# Patient Record
Sex: Male | Born: 1960
Health system: Southern US, Community
[De-identification: ages and names within clinical notes are randomized; demographics above are authoritative.]

## PROBLEM LIST (undated history)

## (undated) DIAGNOSIS — E7800 Pure hypercholesterolemia, unspecified: Secondary | ICD-10-CM

## (undated) DIAGNOSIS — F410 Panic disorder [episodic paroxysmal anxiety] without agoraphobia: Secondary | ICD-10-CM

## (undated) DIAGNOSIS — N529 Male erectile dysfunction, unspecified: Secondary | ICD-10-CM

## (undated) DIAGNOSIS — K219 Gastro-esophageal reflux disease without esophagitis: Secondary | ICD-10-CM

## (undated) DIAGNOSIS — I4891 Unspecified atrial fibrillation: Secondary | ICD-10-CM

## (undated) DIAGNOSIS — G4733 Obstructive sleep apnea (adult) (pediatric): Secondary | ICD-10-CM

## (undated) DIAGNOSIS — M1990 Unspecified osteoarthritis, unspecified site: Secondary | ICD-10-CM

## (undated) DIAGNOSIS — E785 Hyperlipidemia, unspecified: Secondary | ICD-10-CM

## (undated) DIAGNOSIS — I639 Cerebral infarction, unspecified: Secondary | ICD-10-CM

## (undated) DIAGNOSIS — I10 Essential (primary) hypertension: Secondary | ICD-10-CM

## (undated) DIAGNOSIS — E291 Testicular hypofunction: Secondary | ICD-10-CM

## (undated) HISTORY — DX: Pure hypercholesterolemia, unspecified: E78.00

## (undated) HISTORY — DX: Gastro-esophageal reflux disease without esophagitis: K21.9

## (undated) HISTORY — PX: RHINOPLASTY: SUR1284

## (undated) HISTORY — PX: ROTATOR CUFF REPAIR: SHX139

## (undated) HISTORY — DX: Testicular hypofunction: E29.1

## (undated) HISTORY — DX: Panic disorder (episodic paroxysmal anxiety): F41.0

## (undated) HISTORY — PX: EYE SURGERY: SHX253

## (undated) HISTORY — DX: Obstructive sleep apnea (adult) (pediatric): G47.33

## (undated) HISTORY — PX: HERNIA REPAIR: SHX51

## (undated) HISTORY — PX: COLONOSCOPY: SHX174

## (undated) HISTORY — DX: Male erectile dysfunction, unspecified: N52.9

## (undated) HISTORY — DX: Hyperlipidemia, unspecified: E78.5

---

## 2006-12-07 ENCOUNTER — Ambulatory Visit (HOSPITAL_COMMUNITY): Admission: RE | Admit: 2006-12-07 | Discharge: 2006-12-07 | Payer: Self-pay | Admitting: Interventional Cardiology

## 2007-01-28 ENCOUNTER — Inpatient Hospital Stay (HOSPITAL_COMMUNITY): Admission: EM | Admit: 2007-01-28 | Discharge: 2007-01-31 | Payer: Self-pay | Admitting: Emergency Medicine

## 2013-07-17 ENCOUNTER — Other Ambulatory Visit (HOSPITAL_BASED_OUTPATIENT_CLINIC_OR_DEPARTMENT_OTHER): Payer: Self-pay | Admitting: Family Medicine

## 2013-07-17 ENCOUNTER — Encounter (HOSPITAL_BASED_OUTPATIENT_CLINIC_OR_DEPARTMENT_OTHER): Payer: Self-pay

## 2013-07-17 ENCOUNTER — Ambulatory Visit (HOSPITAL_BASED_OUTPATIENT_CLINIC_OR_DEPARTMENT_OTHER)
Admission: RE | Admit: 2013-07-17 | Discharge: 2013-07-17 | Disposition: A | Discharge status: Home / Self Care | Payer: Managed Care, Other (non HMO) | Source: Ambulatory Visit | Attending: Family Medicine | Admitting: Family Medicine

## 2013-07-17 DIAGNOSIS — R197 Diarrhea, unspecified: Secondary | ICD-10-CM | POA: Insufficient documentation

## 2013-07-17 DIAGNOSIS — R109 Unspecified abdominal pain: Secondary | ICD-10-CM

## 2013-07-17 MED ORDER — IOHEXOL 300 MG/ML  SOLN
100.0000 mL | Freq: Once | INTRAMUSCULAR | Status: AC | PRN
  Administered 2013-07-17: 100 mL via INTRAVENOUS

## 2013-08-03 ENCOUNTER — Encounter: Payer: Self-pay | Admitting: Interventional Cardiology

## 2013-08-03 ENCOUNTER — Encounter: Payer: Self-pay | Admitting: *Deleted

## 2013-08-03 DIAGNOSIS — F410 Panic disorder [episodic paroxysmal anxiety] without agoraphobia: Secondary | ICD-10-CM | POA: Insufficient documentation

## 2013-08-03 DIAGNOSIS — E785 Hyperlipidemia, unspecified: Secondary | ICD-10-CM | POA: Insufficient documentation

## 2013-08-03 DIAGNOSIS — E7800 Pure hypercholesterolemia, unspecified: Secondary | ICD-10-CM | POA: Insufficient documentation

## 2013-08-03 DIAGNOSIS — E291 Testicular hypofunction: Secondary | ICD-10-CM | POA: Insufficient documentation

## 2013-08-03 DIAGNOSIS — G4733 Obstructive sleep apnea (adult) (pediatric): Secondary | ICD-10-CM | POA: Insufficient documentation

## 2013-08-03 DIAGNOSIS — K219 Gastro-esophageal reflux disease without esophagitis: Secondary | ICD-10-CM | POA: Insufficient documentation

## 2013-08-05 ENCOUNTER — Ambulatory Visit (INDEPENDENT_AMBULATORY_CARE_PROVIDER_SITE_OTHER): Payer: Managed Care, Other (non HMO) | Admitting: Cardiology

## 2013-08-05 ENCOUNTER — Encounter: Payer: Self-pay | Admitting: Cardiology

## 2013-08-05 VITALS — BP 142/80 | HR 81 | Ht 70.0 in | Wt 199.0 lb

## 2013-08-05 DIAGNOSIS — G4733 Obstructive sleep apnea (adult) (pediatric): Secondary | ICD-10-CM

## 2013-08-05 NOTE — Progress Notes (Signed)
  [address] [place], [place]  [address] Phone: [phone] Fax:  [phone]  Date:  08/05/2013   ID:  Brett Humphrey, DOB [birth year], MRN [id number]  PCP:  Brett Boys, MD  Sleep Medicine:  Brett Magic, MD   History of Present Illness: Brett Humphrey is a 52 y.o. male with a history of OSA/obesity.  He is doing well.  He tolerates his CPAP device well.  He tolerates the full face mask and feels the pressure is adequate.  He feels rested when he gets up in the am and has no daytime sleepiness. He does not think he snores when wearing the mask.   He does not get much aerobic exercise.   Wt Readings from Last 3 Encounters:  08/05/13 199 lb (90.266 kg)     Past Medical History  Diagnosis Date  . Hyperlipidemia   . Hypercholesteremia   . GERD (gastroesophageal reflux disease)   . Panic disorder without agoraphobia   . Male hypogonadism   . OSA (obstructive sleep apnea)     Current Outpatient Prescriptions  Medication Sig Dispense Refill  . ANDROGEL PUMP 20.25 MG/ACT (1.62%) GEL Apply to skin daily      . Multiple Vitamin (MULTIVITAMIN) capsule Take 1 capsule by mouth daily.      . naproxen (NAPROSYN) 500 MG tablet As needed  (shoulder surgery)      . Omega-3 Fatty Acids (FISH OIL PO) Take 1 tablet by mouth daily.      Marland Kitchen omeprazole (PRILOSEC) 40 MG capsule Take 40 mg by mouth daily.      . rosuvastatin (CRESTOR) 20 MG tablet Take 20 mg by mouth daily.      . vardenafil (LEVITRA) 10 MG tablet Take 10 mg by mouth daily as needed for erectile dysfunction.      Marland Kitchen venlafaxine (EFFEXOR) 75 MG tablet Take 1 tab daily      . zolpidem (AMBIEN) 10 MG tablet Take 10 mg by mouth at bedtime as needed for sleep.       No current facility-administered medications for this visit.    Allergies:    Allergies  Allergen Reactions  . Simvastatin      myalgias     Social History:  The patient  reports that he has quit smoking. He does not have any smokeless tobacco history on file.     Family History:  The patient's family history includes Asthma in his sister; Cancer in his brother and mother; Heart attack in his father.   ROS:  Please see the history of present illness.      All other systems reviewed and negative.   PHYSICAL EXAM: VS:  BP 142/80  Pulse 81  Ht 5\' 10"  (1.778 m)  Wt 199 lb (90.266 kg)  BMI 28.55 kg/m2 Well nourished, well developed, in no acute distress HEENT: normal Neck: no JVD Cardiac:  normal S1, S2; RRR; no murmur Lungs:  clear to auscultation bilaterally, no wheezing, rhonchi or rales Abd: soft, nontender, no hepatomegaly Ext: no edema Skin: warm and dry Neuro:  CNs 2-12 intact, no focal abnormalities noted       ASSESSMENT AND PLAN:  1. OSA on CPAP and tolerating well  - I will get a download from his DME 2. Obesity  - I encouraged him to increase his aerobic exercise.  Signed, Brett Magic, MD 08/05/2013 3:43 PM

## 2013-08-05 NOTE — Patient Instructions (Signed)
Your physician wants you to follow-up in: 6 Months with Dr. Turner You will receive a reminder letter in the mail two months in advance. If you don't receive a letter, please call our office to schedule the follow-up appointment.  

## 2013-11-07 ENCOUNTER — Encounter: Payer: Self-pay | Admitting: General Surgery

## 2016-08-23 ENCOUNTER — Other Ambulatory Visit: Payer: Self-pay | Admitting: Family Medicine

## 2016-08-23 ENCOUNTER — Ambulatory Visit
Admission: RE | Admit: 2016-08-23 | Discharge: 2016-08-23 | Disposition: A | Discharge status: Home / Self Care | Payer: Managed Care, Other (non HMO) | Source: Ambulatory Visit | Attending: Family Medicine | Admitting: Family Medicine

## 2016-08-23 DIAGNOSIS — R1031 Right lower quadrant pain: Secondary | ICD-10-CM

## 2016-08-31 ENCOUNTER — Other Ambulatory Visit: Payer: Self-pay | Admitting: Family Medicine

## 2016-08-31 DIAGNOSIS — R1031 Right lower quadrant pain: Secondary | ICD-10-CM

## 2016-09-04 ENCOUNTER — Other Ambulatory Visit (INDEPENDENT_AMBULATORY_CARE_PROVIDER_SITE_OTHER): Payer: Self-pay | Admitting: Physician Assistant

## 2016-09-05 NOTE — Telephone Encounter (Signed)
Please advise 

## 2016-09-07 ENCOUNTER — Ambulatory Visit
Admission: RE | Admit: 2016-09-07 | Discharge: 2016-09-07 | Disposition: A | Discharge status: Home / Self Care | Payer: Managed Care, Other (non HMO) | Source: Ambulatory Visit | Attending: Family Medicine | Admitting: Family Medicine

## 2016-09-07 DIAGNOSIS — R1031 Right lower quadrant pain: Secondary | ICD-10-CM

## 2016-09-07 MED ORDER — IOPAMIDOL (ISOVUE-300) INJECTION 61%
100.0000 mL | Freq: Once | INTRAVENOUS | Status: AC | PRN
  Administered 2016-09-07: 100 mL via INTRAVENOUS

## 2016-12-02 ENCOUNTER — Other Ambulatory Visit (INDEPENDENT_AMBULATORY_CARE_PROVIDER_SITE_OTHER): Payer: Self-pay | Admitting: Physician Assistant

## 2016-12-02 NOTE — Telephone Encounter (Signed)
Please advise 

## 2017-02-27 ENCOUNTER — Other Ambulatory Visit (INDEPENDENT_AMBULATORY_CARE_PROVIDER_SITE_OTHER): Payer: Self-pay | Admitting: Physician Assistant

## 2017-05-19 ENCOUNTER — Other Ambulatory Visit (INDEPENDENT_AMBULATORY_CARE_PROVIDER_SITE_OTHER): Payer: Self-pay | Admitting: Physician Assistant

## 2017-05-30 ENCOUNTER — Other Ambulatory Visit: Payer: Self-pay | Admitting: Family Medicine

## 2017-05-30 ENCOUNTER — Ambulatory Visit
Admission: RE | Admit: 2017-05-30 | Discharge: 2017-05-30 | Disposition: A | Discharge status: Home / Self Care | Payer: Commercial Managed Care - PPO | Source: Ambulatory Visit | Attending: Family Medicine | Admitting: Family Medicine

## 2017-05-30 DIAGNOSIS — Z01818 Encounter for other preprocedural examination: Secondary | ICD-10-CM

## 2017-06-12 HISTORY — PX: BACK SURGERY: SHX140

## 2017-08-06 ENCOUNTER — Other Ambulatory Visit (INDEPENDENT_AMBULATORY_CARE_PROVIDER_SITE_OTHER): Payer: Self-pay | Admitting: Physician Assistant

## 2017-11-09 ENCOUNTER — Ambulatory Visit
Admission: RE | Admit: 2017-11-09 | Discharge: 2017-11-09 | Disposition: A | Discharge status: Home / Self Care | Payer: Commercial Managed Care - PPO | Source: Ambulatory Visit | Attending: General Surgery | Admitting: General Surgery

## 2017-11-09 ENCOUNTER — Other Ambulatory Visit: Payer: Self-pay | Admitting: General Surgery

## 2017-11-09 DIAGNOSIS — K4020 Bilateral inguinal hernia, without obstruction or gangrene, not specified as recurrent: Secondary | ICD-10-CM

## 2017-11-13 ENCOUNTER — Ambulatory Visit: Payer: Self-pay | Admitting: General Surgery

## 2017-11-13 ENCOUNTER — Encounter (HOSPITAL_COMMUNITY): Payer: Self-pay | Admitting: *Deleted

## 2017-11-13 ENCOUNTER — Other Ambulatory Visit (INDEPENDENT_AMBULATORY_CARE_PROVIDER_SITE_OTHER): Payer: Self-pay | Admitting: Orthopaedic Surgery

## 2017-11-13 NOTE — Patient Instructions (Addendum)
Brett Humphrey  11/13/2017   Your procedure is scheduled on: Friday 11/17/2017  Report to [facility] Main  Entrance              Report to admitting at  1130 AM   Call this number if you have problems the morning of surgery [phone]               Please bring mask and tubing for BIPAP morning of surgery!   Remember: Do not eat food After Midnight.  May have clear liquids from midnight up until 0730 am morning of surgery and then nothing until after surgery!    CLEAR LIQUID DIET   Foods Allowed                                                                     Foods Excluded  Coffee and tea, regular and decaf                             liquids that you cannot  Plain Jell-O in any flavor                                             see through such as: Fruit ices (not with fruit pulp)                                     milk, soups, orange juice  Iced Popsicles                                    All solid food Carbonated beverages, regular and diet                                    Cranberry, grape and apple juices Sports drinks like Gatorade Lightly seasoned clear broth or consume(fat free) Sugar, honey syrup  Sample Menu Breakfast                                Lunch                                     Supper Cranberry juice                    Beef broth                            Chicken broth Jell-O  Grape juice                           Apple juice Coffee or tea                        Jell-O                                      Popsicle                                                Coffee or tea                        Coffee or tea  _____________________________________________________________________       Take these medicines the morning of surgery with A SIP OF WATER:  Venlafaxine (Effexor), Rosuvastatin (Crestor), Ranitidine (Zantac), Percocet if needed                                You may not have  any metal on your body including hair pins and              piercings  Do not wear jewelry, make-up, lotions, powders or perfumes, deodorant             Do not wear nail polish.  Do not shave  48 hours prior to surgery.              Men may shave face and neck.   Do not bring valuables to the hospital. [facility] IS NOT             RESPONSIBLE   FOR VALUABLES.  Contacts, dentures or bridgework may not be worn into surgery.  Leave suitcase in the car. After surgery it may be brought to your room.     Patients discharged the day of surgery will not be allowed to drive home.  Name and phone number of your driver:spouse- Ann  Special Instructions: N/A              Please read over the following fact sheets you were given: _____________________________________________________________________             [facility] Pre[facility] Before surgery, you can play an important role.  Because skin is not sterile, your skin needs to be as free of germs as possible.  You can reduce the number of germs on your skin by washing with CHG (chlorahexidine gluconate) soap before surgery.  CHG is an antiseptic cleaner which kills germs and bonds with the skin to continue killing germs even after washing. Please DO NOT use if you have an allergy to CHG or antibacterial soaps.  If your skin becomes reddened/irritated stop using the CHG and inform your nurse when you arrive at Short Stay. Do not shave (including legs and underarms) for at least 48 hours prior to the first CHG shower.  You may shave your face/neck. Please follow these instructions carefully:  1.  Shower with CHG Soap the night before surgery and the  morning of Surgery.  2.  If you choose to wash your  hair, wash your hair first as usual with your  normal  shampoo.  3.  After you shampoo, rinse your hair and body thoroughly to remove the  shampoo.                           4.  Use CHG as you would any other liquid soap.  You can apply chg  directly  to the skin and wash                       Gently with a scrungie or clean washcloth.  5.  Apply the CHG Soap to your body ONLY FROM THE NECK DOWN.   Do not use on face/ open                           Wound or open sores. Avoid contact with eyes, ears mouth and genitals (private parts).                       Wash face,  Genitals (private parts) with your normal soap.             6.  Wash thoroughly, paying special attention to the area where your surgery  will be performed.  7.  Thoroughly rinse your body with warm water from the neck down.  8.  DO NOT shower/wash with your normal soap after using and rinsing off  the CHG Soap.                9.  Pat yourself dry with a clean towel.            10.  Wear clean pajamas.            11.  Place clean sheets on your bed the night of your first shower and do not  sleep with pets. Day of Surgery : Do not apply any lotions/deodorants the morning of surgery.  Please wear clean clothes to the hospital/surgery center.  FAILURE TO FOLLOW THESE INSTRUCTIONS MAY RESULT IN THE CANCELLATION OF YOUR SURGERY PATIENT SIGNATURE_________________________________  NURSE SIGNATURE__________________________________  ________________________________________________________________________

## 2017-11-13 NOTE — H&P (View-Only) (Signed)
  History of Present Illness Brett Humphrey(Irina Okelly MD; 11/08/2017 4:08 PM) The patient is a 57 year old male who presents with a complaint of right inguinal pain. Patient is a 57 year old male who previously underwent laparoscopic bilateral inguinal hernia repairs with mesh approximately 1 year ago. Patient had a laminectomy by Dr. Shon BatonBrooks in September 2018. Patient states that thereafter he was doing physical therapy and noticed some right inguinal pain. He states he felt hernia pops in and out during this time. He states that since that time is continue with right inguinal pain. Patient has not had any previous studies of his right inguinal pain.     Allergies Sander Nephew(Danielle Gerrigner, CMA; 11/08/2017 3:49 PM) Simvastatin *ANTIHYPERLIPIDEMICS*  Allergies Reconciled   Medication History (Danielle Gerrigner, CMA; 11/08/2017 3:50 PM) Oxycodone-Acetaminophen (10-325MG  Tablet, 1 (one) Oral every six hours, as needed, Taken starting 10/25/2017) Active. RaNITidine HCl (150MG  Tablet, Oral) Active. Testosterone (30MG /ACT Solution, Transdermal) Active. Hydrocodone-Acetaminophen (5-325MG  Tablet, Oral) Active. Zolpidem Tartrate (10MG  Tablet, Oral) Active. Losartan Potassium (100MG  Tablet, Oral) Active. Effexor XR (75MG  Capsule ER 24HR, Oral) Active. Glucosamine Chondr 500 Complex (Oral) Active. Ferrous Sulfate (325 (65 Fe)MG Tablet, Oral) Active. Multiple Vitamin (Oral) Active. Fish Oil (1000MG  Capsule, Oral) Active. Vascepa (1GM Capsule, Oral) Active. Atorvastatin Calcium (40MG  Tablet, Oral) Active. Vardenafil HCl (2.5MG  Tablet, Oral) Active. Medications Reconciled  Vitals (Danielle Gerrigner CMA; 11/08/2017 3:50 PM) 11/08/2017 3:50 PM Weight: 199.38 lb Height: 70in Body Surface Area: 2.08 m Body Mass Index: 28.61 kg/m  Temp.: 97.28F(Oral)  Pulse: 81 (Regular)  BP: 176/110 (Sitting, Right Arm, Standard)       Physical Exam Brett Humphrey(Jenisis Harmsen MD; 11/08/2017 4:08 PM) The  physical exam findings are as follows: Note:Constitutional: No acute distress, conversant, appears stated age  Eyes: Anicteric sclerae, moist conjunctiva, no lid lag  Neck: No thyromegaly, trachea midline, no cervical lymphadenopathy  Lungs: Clear to auscultation biilaterally, normal respiratory effot  Cardiovascular: regular rate & rhythm, no murmurs, no peripheal edema, pedal pulses 2+  GI: Soft, no masses or hepatosplenomegaly, non-tender to palpation, tenderness to palpation in the right scrotum, right inguinal area, no hernia palpated on Valsalva.  MSK: Normal gait, no clubbing cyanosis, edema  Skin: No rashes, palpation reveals normal skin turgor  Psychiatric: Appropriate judgment and insight, oriented to person, place, and time    Assessment & Plan Brett Humphrey(Arda Keadle MD; 11/08/2017 4:10 PM) RECURRENT RIGHT INGUINAL HERNIA (K40.91) Impression: Patient is a 57 year old male with a recurrent right inguinal hernia. 1. Will proceed to the OR for an open right inguinal hernia repair with mesh 2.  All risks and benefits were discussed with the patient, to generally include infection, bleeding, damage to surrounding structures, acute and chronic nerve pain, and recurrence. Alternatives were offered and described.  All questions were answered and the patient voiced understanding of the procedure and wishes to proceed at this point.

## 2017-11-13 NOTE — H&P (Signed)
  History of Present Illness (Penn Grissett MD; 11/08/2017 4:08 PM) The patient is a 57 year old male who presents with a complaint of right inguinal pain. Patient is a 57-year-old male who previously underwent laparoscopic bilateral inguinal hernia repairs with mesh approximately 1 year ago. Patient had a laminectomy by Dr. Brooks in September 2018. Patient states that thereafter he was doing physical therapy and noticed some right inguinal pain. He states he felt hernia pops in and out during this time. He states that since that time is continue with right inguinal pain. Patient has not had any previous studies of his right inguinal pain.     Allergies (Danielle Gerrigner, CMA; 11/08/2017 3:49 PM) Simvastatin *ANTIHYPERLIPIDEMICS*  Allergies Reconciled   Medication History (Danielle Gerrigner, CMA; 11/08/2017 3:50 PM) Oxycodone-Acetaminophen (10-325MG Tablet, 1 (one) Oral every six hours, as needed, Taken starting 10/25/2017) Active. RaNITidine HCl (150MG Tablet, Oral) Active. Testosterone (30MG/ACT Solution, Transdermal) Active. Hydrocodone-Acetaminophen (5-325MG Tablet, Oral) Active. Zolpidem Tartrate (10MG Tablet, Oral) Active. Losartan Potassium (100MG Tablet, Oral) Active. Effexor XR (75MG Capsule ER 24HR, Oral) Active. Glucosamine Chondr 500 Complex (Oral) Active. Ferrous Sulfate (325 (65 Fe)MG Tablet, Oral) Active. Multiple Vitamin (Oral) Active. Fish Oil (1000MG Capsule, Oral) Active. Vascepa (1GM Capsule, Oral) Active. Atorvastatin Calcium (40MG Tablet, Oral) Active. Vardenafil HCl (2.5MG Tablet, Oral) Active. Medications Reconciled  Vitals (Danielle Gerrigner CMA; 11/08/2017 3:50 PM) 11/08/2017 3:50 PM Weight: 199.38 lb Height: 70in Body Surface Area: 2.08 m Body Mass Index: 28.61 kg/m  Temp.: 97.4F(Oral)  Pulse: 81 (Regular)  BP: 176/110 (Sitting, Right Arm, Standard)       Physical Exam (Naol Ontiveros MD; 11/08/2017 4:08 PM) The  physical exam findings are as follows: Note:Constitutional: No acute distress, conversant, appears stated age  Eyes: Anicteric sclerae, moist conjunctiva, no lid lag  Neck: No thyromegaly, trachea midline, no cervical lymphadenopathy  Lungs: Clear to auscultation biilaterally, normal respiratory effot  Cardiovascular: regular rate & rhythm, no murmurs, no peripheal edema, pedal pulses 2+  GI: Soft, no masses or hepatosplenomegaly, non-tender to palpation, tenderness to palpation in the right scrotum, right inguinal area, no hernia palpated on Valsalva.  MSK: Normal gait, no clubbing cyanosis, edema  Skin: No rashes, palpation reveals normal skin turgor  Psychiatric: Appropriate judgment and insight, oriented to person, place, and time    Assessment & Plan (Bellamy Rubey MD; 11/08/2017 4:10 PM) RECURRENT RIGHT INGUINAL HERNIA (K40.91) Impression: Patient is a 57-year-old male with a recurrent right inguinal hernia. 1. Will proceed to the OR for an open right inguinal hernia repair with mesh 2.  All risks and benefits were discussed with the patient, to generally include infection, bleeding, damage to surrounding structures, acute and chronic nerve pain, and recurrence. Alternatives were offered and described.  All questions were answered and the patient voiced understanding of the procedure and wishes to proceed at this point.  

## 2017-11-14 ENCOUNTER — Encounter (HOSPITAL_COMMUNITY)
Admission: RE | Admit: 2017-11-14 | Discharge: 2017-11-14 | Disposition: A | Discharge status: Home / Self Care | Payer: Commercial Managed Care - PPO | Source: Ambulatory Visit | Attending: General Surgery | Admitting: General Surgery

## 2017-11-14 ENCOUNTER — Other Ambulatory Visit: Payer: Self-pay

## 2017-11-14 ENCOUNTER — Encounter (HOSPITAL_COMMUNITY): Payer: Self-pay

## 2017-11-14 DIAGNOSIS — G4730 Sleep apnea, unspecified: Secondary | ICD-10-CM | POA: Diagnosis not present

## 2017-11-14 DIAGNOSIS — K4091 Unilateral inguinal hernia, without obstruction or gangrene, recurrent: Secondary | ICD-10-CM | POA: Diagnosis present

## 2017-11-14 DIAGNOSIS — F419 Anxiety disorder, unspecified: Secondary | ICD-10-CM | POA: Diagnosis not present

## 2017-11-14 DIAGNOSIS — Z87891 Personal history of nicotine dependence: Secondary | ICD-10-CM | POA: Diagnosis not present

## 2017-11-14 DIAGNOSIS — K219 Gastro-esophageal reflux disease without esophagitis: Secondary | ICD-10-CM | POA: Diagnosis not present

## 2017-11-14 DIAGNOSIS — I10 Essential (primary) hypertension: Secondary | ICD-10-CM | POA: Diagnosis not present

## 2017-11-14 DIAGNOSIS — Z79899 Other long term (current) drug therapy: Secondary | ICD-10-CM | POA: Diagnosis not present

## 2017-11-14 HISTORY — DX: Essential (primary) hypertension: I10

## 2017-11-14 LAB — BASIC METABOLIC PANEL
ANION GAP: 9 (ref 5–15)
BUN: 12 mg/dL (ref 6–20)
CHLORIDE: 104 mmol/L (ref 101–111)
CO2: 25 mmol/L (ref 22–32)
Calcium: 8.8 mg/dL — ABNORMAL LOW (ref 8.9–10.3)
Creatinine, Ser: 0.96 mg/dL (ref 0.61–1.24)
GFR calc Af Amer: >60 mL/min (ref >60)
GFR calc non Af Amer: >60 mL/min (ref >60)
GLUCOSE: 107 mg/dL — AB (ref 65–99)
Potassium: 3.7 mmol/L (ref 3.5–5.1)
Sodium: 138 mmol/L (ref 135–145)

## 2017-11-14 LAB — CBC
HEMATOCRIT: 46.7 % (ref 39.0–52.0)
HEMOGLOBIN: 14.1 g/dL (ref 13.0–17.0)
MCH: 31.1 pg (ref 26.0–34.0)
MCHC: 30.2 g/dL (ref 30.0–36.0)
MCV: 103.1 fL — AB (ref 78.0–100.0)
Platelets: 248 10*3/uL (ref 150–400)
RBC: 4.53 MIL/uL (ref 4.22–5.81)
RDW: 12.9 % (ref 11.5–15.5)
WBC: 4.2 10*3/uL (ref 4.0–10.5)

## 2017-11-14 NOTE — Progress Notes (Signed)
Spoke to Dr. Nancy Nordmannarswell Jackson,MDA face to face about EKG from 11/14/2017 and per Dr. Jean RosenthalJackson, patient is OK for surgery.

## 2017-11-14 NOTE — Progress Notes (Signed)
05/30/2017- E[facility] from [facility]  On chart.

## 2017-11-17 ENCOUNTER — Other Ambulatory Visit: Payer: Self-pay

## 2017-11-17 ENCOUNTER — Encounter (HOSPITAL_COMMUNITY): Payer: Self-pay | Admitting: *Deleted

## 2017-11-17 ENCOUNTER — Encounter (HOSPITAL_COMMUNITY)
Admission: RE | Disposition: A | Discharge status: Home / Self Care | Payer: Self-pay | Source: Ambulatory Visit | Attending: General Surgery

## 2017-11-17 ENCOUNTER — Ambulatory Visit (HOSPITAL_COMMUNITY)
Admission: RE | Admit: 2017-11-17 | Discharge: 2017-11-17 | Disposition: A | Discharge status: Home / Self Care | Payer: Commercial Managed Care - PPO | Source: Ambulatory Visit | Attending: General Surgery | Admitting: General Surgery

## 2017-11-17 ENCOUNTER — Ambulatory Visit (HOSPITAL_COMMUNITY): Payer: Commercial Managed Care - PPO | Admitting: Anesthesiology

## 2017-11-17 DIAGNOSIS — Z87891 Personal history of nicotine dependence: Secondary | ICD-10-CM | POA: Insufficient documentation

## 2017-11-17 DIAGNOSIS — G4730 Sleep apnea, unspecified: Secondary | ICD-10-CM | POA: Insufficient documentation

## 2017-11-17 DIAGNOSIS — K219 Gastro-esophageal reflux disease without esophagitis: Secondary | ICD-10-CM | POA: Insufficient documentation

## 2017-11-17 DIAGNOSIS — F419 Anxiety disorder, unspecified: Secondary | ICD-10-CM | POA: Insufficient documentation

## 2017-11-17 DIAGNOSIS — Z79899 Other long term (current) drug therapy: Secondary | ICD-10-CM | POA: Insufficient documentation

## 2017-11-17 DIAGNOSIS — K4091 Unilateral inguinal hernia, without obstruction or gangrene, recurrent: Secondary | ICD-10-CM | POA: Insufficient documentation

## 2017-11-17 DIAGNOSIS — I10 Essential (primary) hypertension: Secondary | ICD-10-CM | POA: Insufficient documentation

## 2017-11-17 HISTORY — PX: INSERTION OF MESH: SHX5868

## 2017-11-17 HISTORY — PX: INGUINAL HERNIA REPAIR: SHX194

## 2017-11-17 SURGERY — REPAIR, HERNIA, INGUINAL, ADULT
Anesthesia: General | Laterality: Right

## 2017-11-17 MED ORDER — ROPIVACAINE HCL 7.5 MG/ML IJ SOLN
INTRAMUSCULAR | Status: DC | PRN
  Administered 2017-11-17: 20 mL via PERINEURAL

## 2017-11-17 MED ORDER — ROCURONIUM BROMIDE 10 MG/ML (PF) SYRINGE
PREFILLED_SYRINGE | INTRAVENOUS | Status: AC
  Filled 2017-11-17: qty 5

## 2017-11-17 MED ORDER — MORPHINE SULFATE (PF) 4 MG/ML IV SOLN: 2.0000 mg | INTRAVENOUS | Status: DC | PRN

## 2017-11-17 MED ORDER — SUGAMMADEX SODIUM 200 MG/2ML IV SOLN
INTRAVENOUS | Status: DC | PRN
  Administered 2017-11-17: 200 mg via INTRAVENOUS

## 2017-11-17 MED ORDER — BUPIVACAINE HCL (PF) 0.25 % IJ SOLN
INTRAMUSCULAR | Status: AC
  Filled 2017-11-17: qty 30

## 2017-11-17 MED ORDER — CELECOXIB 200 MG PO CAPS
200.0000 mg | ORAL_CAPSULE | ORAL | Status: AC
  Administered 2017-11-17: 200 mg via ORAL
  Filled 2017-11-17: qty 1

## 2017-11-17 MED ORDER — ONDANSETRON HCL 4 MG/2ML IJ SOLN
INTRAMUSCULAR | Status: DC | PRN
  Administered 2017-11-17: 4 mg via INTRAVENOUS

## 2017-11-17 MED ORDER — DEXAMETHASONE SODIUM PHOSPHATE 10 MG/ML IJ SOLN
INTRAMUSCULAR | Status: DC | PRN
  Administered 2017-11-17: 10 mg via INTRAVENOUS

## 2017-11-17 MED ORDER — ROCURONIUM BROMIDE 10 MG/ML (PF) SYRINGE
PREFILLED_SYRINGE | INTRAVENOUS | Status: DC | PRN
  Administered 2017-11-17: 50 mg via INTRAVENOUS

## 2017-11-17 MED ORDER — ACETAMINOPHEN 325 MG PO TABS: 650.0000 mg | ORAL_TABLET | ORAL | Status: DC | PRN

## 2017-11-17 MED ORDER — OXYCODONE HCL 5 MG PO TABS
ORAL_TABLET | ORAL | Status: AC
  Filled 2017-11-17: qty 1

## 2017-11-17 MED ORDER — CHLORHEXIDINE GLUCONATE CLOTH 2 % EX PADS: 6.0000 | MEDICATED_PAD | Freq: Once | CUTANEOUS | Status: DC

## 2017-11-17 MED ORDER — MIDAZOLAM HCL 2 MG/2ML IJ SOLN
1.0000 mg | INTRAMUSCULAR | Status: DC
  Administered 2017-11-17: 2 mg via INTRAVENOUS

## 2017-11-17 MED ORDER — PROPOFOL 10 MG/ML IV BOLUS
INTRAVENOUS | Status: AC
  Filled 2017-11-17: qty 20

## 2017-11-17 MED ORDER — LIDOCAINE 2% (20 MG/ML) 5 ML SYRINGE
INTRAMUSCULAR | Status: AC
  Filled 2017-11-17: qty 5

## 2017-11-17 MED ORDER — PROPOFOL 10 MG/ML IV BOLUS
INTRAVENOUS | Status: DC | PRN
  Administered 2017-11-17: 170 mg via INTRAVENOUS
  Administered 2017-11-17: 60 mg via INTRAVENOUS

## 2017-11-17 MED ORDER — ACETAMINOPHEN 500 MG PO TABS
1000.0000 mg | ORAL_TABLET | ORAL | Status: AC
  Administered 2017-11-17: 1000 mg via ORAL
  Filled 2017-11-17: qty 2

## 2017-11-17 MED ORDER — BUPIVACAINE HCL (PF) 0.25 % IJ SOLN
INTRAMUSCULAR | Status: DC | PRN
  Administered 2017-11-17: 15 mL

## 2017-11-17 MED ORDER — ONDANSETRON HCL 4 MG/2ML IJ SOLN: 4.0000 mg | Freq: Four times a day (QID) | INTRAMUSCULAR | Status: DC | PRN

## 2017-11-17 MED ORDER — SUGAMMADEX SODIUM 200 MG/2ML IV SOLN
INTRAVENOUS | Status: AC
  Filled 2017-11-17: qty 2

## 2017-11-17 MED ORDER — OXYCODONE HCL 5 MG PO TABS
5.0000 mg | ORAL_TABLET | Freq: Once | ORAL | Status: AC | PRN
  Administered 2017-11-17: 5 mg via ORAL

## 2017-11-17 MED ORDER — SODIUM CHLORIDE 0.9 % IV SOLN: 250.0000 mL | INTRAVENOUS | Status: DC | PRN

## 2017-11-17 MED ORDER — LIDOCAINE 2% (20 MG/ML) 5 ML SYRINGE
INTRAMUSCULAR | Status: DC | PRN
  Administered 2017-11-17: 60 mg via INTRAVENOUS

## 2017-11-17 MED ORDER — LACTATED RINGERS IV SOLN
INTRAVENOUS | Status: DC
  Administered 2017-11-17: 12:00:00 via INTRAVENOUS

## 2017-11-17 MED ORDER — FENTANYL CITRATE (PF) 250 MCG/5ML IJ SOLN
INTRAMUSCULAR | Status: DC | PRN
  Administered 2017-11-17 (×2): 50 ug via INTRAVENOUS

## 2017-11-17 MED ORDER — ACETAMINOPHEN 650 MG RE SUPP
650.0000 mg | RECTAL | Status: DC | PRN
  Filled 2017-11-17: qty 1

## 2017-11-17 MED ORDER — SODIUM CHLORIDE 0.9% FLUSH: 3.0000 mL | INTRAVENOUS | Status: DC | PRN

## 2017-11-17 MED ORDER — FENTANYL CITRATE (PF) 100 MCG/2ML IJ SOLN
50.0000 ug | INTRAMUSCULAR | Status: DC
Start: 2017-11-17 — End: 2017-11-17
  Administered 2017-11-17: 50 ug via INTRAVENOUS

## 2017-11-17 MED ORDER — DEXAMETHASONE SODIUM PHOSPHATE 10 MG/ML IJ SOLN
INTRAMUSCULAR | Status: AC
  Filled 2017-11-17: qty 1

## 2017-11-17 MED ORDER — OXYCODONE HCL 5 MG/5ML PO SOLN
5.0000 mg | Freq: Once | ORAL | Status: AC | PRN
  Filled 2017-11-17: qty 5

## 2017-11-17 MED ORDER — CEFAZOLIN SODIUM-DEXTROSE 2-4 GM/100ML-% IV SOLN
2.0000 g | INTRAVENOUS | Status: AC
  Administered 2017-11-17: 2 g via INTRAVENOUS
  Filled 2017-11-17: qty 100

## 2017-11-17 MED ORDER — MIDAZOLAM HCL 2 MG/2ML IJ SOLN
INTRAMUSCULAR | Status: AC
  Filled 2017-11-17: qty 2

## 2017-11-17 MED ORDER — OXYCODONE-ACETAMINOPHEN 10-325 MG PO TABS: 1.0000 | ORAL_TABLET | ORAL | 0 refills | Status: DC | PRN

## 2017-11-17 MED ORDER — FENTANYL CITRATE (PF) 100 MCG/2ML IJ SOLN
INTRAMUSCULAR | Status: DC
Start: 2017-11-17 — End: 2017-11-17
  Filled 2017-11-17: qty 2

## 2017-11-17 MED ORDER — GABAPENTIN 300 MG PO CAPS
300.0000 mg | ORAL_CAPSULE | ORAL | Status: AC
  Administered 2017-11-17: 300 mg via ORAL
  Filled 2017-11-17: qty 1

## 2017-11-17 MED ORDER — ONDANSETRON HCL 4 MG/2ML IJ SOLN
INTRAMUSCULAR | Status: AC
  Filled 2017-11-17: qty 2

## 2017-11-17 MED ORDER — FENTANYL CITRATE (PF) 100 MCG/2ML IJ SOLN
INTRAMUSCULAR | Status: AC
  Administered 2017-11-17: 25 ug via INTRAVENOUS
  Filled 2017-11-17: qty 2

## 2017-11-17 MED ORDER — 0.9 % SODIUM CHLORIDE (POUR BTL) OPTIME
TOPICAL | Status: DC | PRN
  Administered 2017-11-17: 1000 mL

## 2017-11-17 MED ORDER — FENTANYL CITRATE (PF) 100 MCG/2ML IJ SOLN
INTRAMUSCULAR | Status: AC
  Filled 2017-11-17: qty 2

## 2017-11-17 MED ORDER — FENTANYL CITRATE (PF) 100 MCG/2ML IJ SOLN
25.0000 ug | INTRAMUSCULAR | Status: DC | PRN
  Administered 2017-11-17: 50 ug via INTRAVENOUS
  Administered 2017-11-17 (×2): 25 ug via INTRAVENOUS

## 2017-11-17 MED ORDER — OXYCODONE HCL 5 MG PO TABS: 5.0000 mg | ORAL_TABLET | ORAL | Status: DC | PRN

## 2017-11-17 MED ORDER — SODIUM CHLORIDE 0.9% FLUSH: 3.0000 mL | Freq: Two times a day (BID) | INTRAVENOUS | Status: DC

## 2017-11-17 SURGICAL SUPPLY — 41 items
ADH SKN CLS APL DERMABOND .7 (GAUZE/BANDAGES/DRESSINGS) ×1
APL SKNCLS STERI-STRIP NONHPOA (GAUZE/BANDAGES/DRESSINGS)
BAG URINE DRAINAGE (UROLOGICAL SUPPLIES) IMPLANT
BENZOIN TINCTURE PRP APPL 2/3 (GAUZE/BANDAGES/DRESSINGS) IMPLANT
BLADE SURG 15 STRL LF DISP TIS (BLADE) IMPLANT
BLADE SURG 15 STRL SS (BLADE)
BLADE SURG SZ10 CARB STEEL (BLADE) ×2 IMPLANT
CATH FOLEY 3WAY 30CC 16FR (CATHETERS) ×2 IMPLANT
CHLORAPREP W/TINT 26ML (MISCELLANEOUS) ×2 IMPLANT
DECANTER SPIKE VIAL GLASS SM (MISCELLANEOUS) ×2 IMPLANT
DERMABOND ADVANCED (GAUZE/BANDAGES/DRESSINGS) ×1
DERMABOND ADVANCED .7 DNX12 (GAUZE/BANDAGES/DRESSINGS) ×1 IMPLANT
DRAIN PENROSE 18X1/2 LTX STRL (DRAIN) ×2 IMPLANT
DRAPE LAPAROSCOPIC ABDOMINAL (DRAPES) ×2 IMPLANT
ELECT PENCIL ROCKER SW 15FT (MISCELLANEOUS) ×2 IMPLANT
ELECT REM PT RETURN 15FT ADLT (MISCELLANEOUS) ×2 IMPLANT
GAUZE SPONGE 4X4 16PLY XRAY LF (GAUZE/BANDAGES/DRESSINGS) ×2 IMPLANT
GLOVE BIO SURGEON STRL SZ7.5 (GLOVE) ×2 IMPLANT
GOWN STRL REUS W/TWL XL LVL3 (GOWN DISPOSABLE) ×4 IMPLANT
KIT BASIN OR (CUSTOM PROCEDURE TRAY) ×2 IMPLANT
MESH PARIETEX PROGRIP RIGHT (Mesh General) ×2 IMPLANT
NEEDLE HYPO 22GX1.5 SAFETY (NEEDLE) ×2 IMPLANT
PACK BASIC VI WITH GOWN DISP (CUSTOM PROCEDURE TRAY) ×2 IMPLANT
SET IRRIG Y TYPE TUR BLADDER L (SET/KITS/TRAYS/PACK) ×2 IMPLANT
STRIP CLOSURE SKIN 1/2X4 (GAUZE/BANDAGES/DRESSINGS) IMPLANT
SUT MNCRL AB 4-0 PS2 18 (SUTURE) ×2 IMPLANT
SUT PROLENE 2 0 SH DA (SUTURE) ×2 IMPLANT
SUT SILK 0 (SUTURE)
SUT SILK 0 30XBRD TIE 6 (SUTURE) IMPLANT
SUT SILK 2 0 (SUTURE)
SUT SILK 2-0 18XBRD TIE 12 (SUTURE) IMPLANT
SUT VIC AB 2-0 SH 27 (SUTURE) ×4
SUT VIC AB 2-0 SH 27X BRD (SUTURE) ×2 IMPLANT
SUT VIC AB 3-0 SH 27 (SUTURE) ×2
SUT VIC AB 3-0 SH 27XBRD (SUTURE) ×1 IMPLANT
SYR BULB IRRIGATION 50ML (SYRINGE) ×2 IMPLANT
SYR CONTROL 10ML LL (SYRINGE) ×2 IMPLANT
TOWEL OR 17X26 10 PK STRL BLUE (TOWEL DISPOSABLE) ×2 IMPLANT
TOWEL OR NON WOVEN STRL DISP B (DISPOSABLE) ×2 IMPLANT
TRAY FOLEY W/METER SILVER 16FR (SET/KITS/TRAYS/PACK) ×2 IMPLANT
YANKAUER SUCT BULB TIP 10FT TU (MISCELLANEOUS) ×2 IMPLANT

## 2017-11-17 NOTE — Discharge Instructions (Signed)
[facility] _______[facility] ° °HERNIA REPAIR: POST OP INSTRUCTIONS ° °Always review your discharge instruction sheet given to you by the facility where your surgery was performed. °IF YOU HAVE DISABILITY OR FAMILY LEAVE FORMS, YOU MUST BRING THEM TO THE OFFICE FOR PROCESSING.   °DO NOT GIVE THEM TO YOUR DOCTOR. ° °1. A  prescription for pain medication may be given to you upon discharge.  Take your pain medication as prescribed, if needed.  If narcotic pain medicine is not needed, then you may take acetaminophen (Tylenol) or ibuprofen (Advil) as needed. °2. Take your usually prescribed medications unless otherwise directed. °If you need a refill on your pain medication, please contact your pharmacy.  They will contact our office to request authorization. Prescriptions will not be filled after 5 pm or on week-ends. °3. You should follow a light diet the first 24 hours after arrival home, such as soup and crackers, etc.  Be sure to include lots of fluids daily.  Resume your normal diet the day after surgery. °4.Most patients will experience some swelling and bruising around the umbilicus or in the groin and scrotum.  Ice packs and reclining will help.  Swelling and bruising can take several days to resolve.  °6. It is common to experience some constipation if taking pain medication after surgery.  Increasing fluid intake and taking a stool softener (such as Colace) will usually help or prevent this problem from occurring.  A mild laxative (Milk of Magnesia or Miralax) should be taken according to package directions if there are no bowel movements after 48 hours. °7. Unless discharge instructions indicate otherwise, you may remove your bandages 24-48 hours after surgery, and you may shower at that time.  You may have steri-strips (small skin tapes) in place directly over the incision.  These strips should be left on the skin for 7-10 days.  If your surgeon used skin glue on the incision, you may shower in  24 hours.  The glue will flake off over the next 2-3 weeks.  Any sutures or staples will be removed at the office during your follow-up visit. °8. ACTIVITIES:  You may resume regular (light) daily activities beginning the next day--such as daily self-care, walking, climbing stairs--gradually increasing activities as tolerated.  You may have sexual intercourse when it is comfortable.  Refrain from any heavy lifting or straining until approved by your doctor. ° °a.You may drive when you are no longer taking prescription pain medication, you can comfortably wear a seatbelt, and you can safely maneuver your car and apply brakes. °b.RETURN TO WORK:   °_____________________________________________ ° °9.You should see your doctor in the office for a follow-up appointment approximately 2-3 weeks after your surgery.  Make sure that you call for this appointment within a day or two after you arrive home to insure a convenient appointment time. °10.OTHER INSTRUCTIONS: _________________________ °   _____________________________________ ° °WHEN TO CALL YOUR DOCTOR: °1. Fever over 101.0 °2. Inability to urinate °3. Nausea and/or vomiting °4. Extreme swelling or bruising °5. Continued bleeding from incision. °6. Increased pain, redness, or drainage from the incision ° °The clinic staff is available to answer your questions during regular business hours.  Please don’t hesitate to call and ask to speak to one of the nurses for clinical concerns.  If you have a medical emergency, go to the nearest emergency room or call 911.  A surgeon from [facility] is always on call at the hospital ° ° °[address], [address], [place], [place]  [address] ? ° [address] [address], [place], [place]   [address] °[phone] ? [phone] ? FAX [phone] °Web site: [email or web address] ° °

## 2017-11-17 NOTE — Interval H&P Note (Signed)
History and Physical Interval Note:  11/17/2017 12:53 PM  Brett Humphrey  has presented today for surgery, with the diagnosis of recurrent RIH  The various methods of treatment have been discussed with the patient and family. After consideration of risks, benefits and other options for treatment, the patient has consented to  Procedure(s): OPEN RIGHT HERNIA REPAIR INGUINAL ADULT (Right) INSERTION OF MESH (Right) as a surgical intervention .  The patient's history has been reviewed, patient examined, no change in status, stable for surgery.  I have reviewed the patient's chart and labs.  Questions were answered to the patient's satisfaction.     Marigene Ehlersamirez Jr., Jed LimerickArmando

## 2017-11-17 NOTE — Anesthesia Procedure Notes (Addendum)
Anesthesia Regional Block: TAP block   Pre-Anesthetic Checklist: ,, timeout performed, Correct Patient, Correct Site, Correct Laterality, Correct Procedure, Correct Position, site marked, Risks and benefits discussed,  Surgical consent,  Pre-op evaluation,  At surgeon's request and post-op pain management  Laterality: Right  Prep: chloraprep       Needles:  Injection technique: Single-shot  Needle Type: Echogenic Needle     Needle Length: 9cm  Needle Gauge: 21     Additional Needles:   Narrative:  Start time: 11/17/2017 12:28 PM End time: 11/17/2017 12:32 PM Injection made incrementally with aspirations every 5 mL.  Performed by: Personally  Anesthesiologist: Achille RichHodierne, Brayam Boeke, MD  Additional Notes: Pt tolerated the procedure well.

## 2017-11-17 NOTE — Anesthesia Preprocedure Evaluation (Signed)
Anesthesia Evaluation  Patient identified by MRN, date of birth, ID band Patient awake    Reviewed: Allergy & Precautions, H&P , NPO status , Patient's Chart, lab work & pertinent test results  Airway Mallampati: II   Neck ROM: full    Dental   Pulmonary sleep apnea , former smoker,    breath sounds clear to auscultation       Cardiovascular hypertension,  Rhythm:regular Rate:Normal     Neuro/Psych PSYCHIATRIC DISORDERS Anxiety    GI/Hepatic GERD  ,  Endo/Other    Renal/GU      Musculoskeletal   Abdominal   Peds  Hematology   Anesthesia Other Findings   Reproductive/Obstetrics                             Anesthesia Physical Anesthesia Plan  ASA: II  Anesthesia Plan: General   Post-op Pain Management:  Regional for Post-op pain   Induction: Intravenous  PONV Risk Score and Plan: 2 and Ondansetron, Dexamethasone, Midazolam and Treatment may vary due to age or medical condition  Airway Management Planned: Oral ETT  Additional Equipment:   Intra-op Plan:   Post-operative Plan: Extubation in OR  Informed Consent: I have reviewed the patients History and Physical, chart, labs and discussed the procedure including the risks, benefits and alternatives for the proposed anesthesia with the patient or authorized representative who has indicated his/her understanding and acceptance.     Plan Discussed with: CRNA, Anesthesiologist and Surgeon  Anesthesia Plan Comments:         Anesthesia Quick Evaluation

## 2017-11-17 NOTE — Transfer of Care (Signed)
Immediate Anesthesia Transfer of Care Note  Patient: Brett Humphrey  Procedure(s) Performed: OPEN RIGHT HERNIA REPAIR INGUINAL ADULT (Right ) INSERTION OF MESH (Right )  Patient Location: PACU  Anesthesia Type:General  Level of Consciousness: sedated  Airway & Oxygen Therapy: Patient Spontanous Breathing and Patient connected to face mask oxygen  Post-op Assessment: VSS  Post vital signs: Reviewed and stable  Last Vitals:  Vitals:   11/17/17 1302 11/17/17 1303  BP:    Pulse: 83 82  Resp: 10 14  Temp:    SpO2: 98% 98%    Last Pain:  Vitals:   11/17/17 1142  TempSrc:   PainSc: 4       Patients Stated Pain Goal: 5 (11/17/17 1142)  Complications: No apparent anesthesia complications

## 2017-11-17 NOTE — Op Note (Signed)
11/17/2017  2:23 PM  PATIENT:  Brett Humphrey  57 y.o. male  PRE-OPERATIVE DIAGNOSIS:  recurrent RIH  POST-OPERATIVE DIAGNOSIS:  recurrent RIH  PROCEDURE:  Procedure(s): OPEN RIGHT HERNIA REPAIR INGUINAL ADULT (Right) INSERTION OF MESH (Right)  SURGEON:  Surgeon(s) and Role:    Axel Filler* Esty Ahuja, MD - Primary  ANESTHESIA:   local and general and regional  EBL:  minimal   BLOOD ADMINISTERED:none  DRAINS: none   LOCAL MEDICATIONS USED:  MARCAINE     SPECIMEN:  No Specimen  DISPOSITION OF SPECIMEN:  N/A  COUNTS:  YES  TOURNIQUET:  * No tourniquets in log *  DICTATION: .Dragon Dictation  Details of the procedure: The patient was taken back to the operating room. The patient was placed in supine position with bilateral SCDs in place. The patient was prepped and draped in the usual sterile fashion.  After appropriate anitbiotics were confirmed, a time-out was confirmed and all facts were verified.  Quarter percent Marcaine was used to infiltrate the area of the incision and an ilioinguinal nerve block was also placed.   A 5 cm incision was made just 1 cm superior to the inguinal ligament. Bovie cautery was used to maintain hemostasis dissection is carried down to the external oblique.  A standard incision was made laterally, and the external oblique was bluntly dissected away from the surrounding tissue with Metzenbaum scissors. The external oblique was elevated in the spermatic cord was bluntly dissected away from the surrounding tissue.  The ilioinguinal nerve was transected with cautery.   The spermatic cord and the hernia were then bluntly dissected away from the pubic tubercle and a Penrose was placed around the hernia sac in the spermatic cord. The vas deferens was identified and protected at all portions of the case. Dissection of the cremasterics took place with Bovie cautery. A cord lipoma was identified in the indirect space and dissected back to the internal ring.  This was  ligated with a 0 silk tie and the preperitoneal fat retracted spontaneously.   At this time a Right-sided Progrip mesh was then anchored to the pubic tubercle with a 2-0 Prolene.  It was anchored to the shelving edge of the external oblique x 1 and the conjoint tendon cephalad x 1.  The wrap around of the mesh was sutured to the conjoint tendon as well.  The new internal ring did not strangulate the spermatic cord.   The tail was then tucked under the external oblique. At this time the area was irrigated out with sterile saline.    The external oblique was reapproximated using a 2-0 Vicryl in a running fashion. Scarpa's fascia was then reapproximated using a 3-0 Vicryl running fashion. The skin was then reapproximated with 4 Monocryl in a subcuticular fashion. The skin was then dressed with Dermabond.  The patient was taken to the recovery room in stable condition.      PLAN OF CARE: Discharge to home after PACU  PATIENT DISPOSITION:  PACU - hemodynamically stable.   Delay start of Pharmacological VTE agent (>24hrs) due to surgical blood loss or risk of bleeding: not applicable

## 2017-11-17 NOTE — Progress Notes (Signed)
PACU/ Short Stay discharge note; pt has ambulated to bathroom to void QS, tolerated activity well with minimal dizziness, tolerated sprite/crackers well with no complaint of nausea; pt has minimal pain after receiving po med; is using ice to right groin; he and wife voice understanding of discharge instructions and have Rx for pain med; he was taken to car via w/c for wife to drive home

## 2017-11-17 NOTE — Anesthesia Procedure Notes (Signed)
Procedure Name: Intubation Date/Time: 11/17/2017 1:33 PM Performed by: Jhonnie GarnerMarshall, Niyam Bisping M, CRNA Pre-anesthesia Checklist: Patient identified, Patient being monitored, Timeout performed, Emergency Drugs available and Suction available Patient Re-evaluated:Patient Re-evaluated prior to induction Oxygen Delivery Method: Circle System Utilized Preoxygenation: Pre-oxygenation with 100% oxygen Induction Type: IV induction Ventilation: Mask ventilation without difficulty Laryngoscope Size: 3 and Miller Grade View: Grade II Tube type: Oral Tube size: 7.5 mm Number of attempts: 1 Airway Equipment and Method: stylet Placement Confirmation: ETT inserted through vocal cords under direct vision,  positive ETCO2 and breath sounds checked- equal and bilateral Secured at: 21 cm Tube secured with: Tape Dental Injury: Teeth and Oropharynx as per pre-operative assessment  Comments: Placed by Paschal DoppKisan Patel SRNA

## 2017-11-20 ENCOUNTER — Encounter (HOSPITAL_COMMUNITY): Payer: Self-pay | Admitting: General Surgery

## 2017-11-20 NOTE — Anesthesia Postprocedure Evaluation (Signed)
Anesthesia Post Note  Patient: Cleon DewKevin Thalmann  Procedure(s) Performed: OPEN RIGHT HERNIA REPAIR INGUINAL ADULT (Right ) INSERTION OF MESH (Right )     Patient location during evaluation: PACU Anesthesia Type: General Level of consciousness: awake and alert Pain management: pain level controlled Vital Signs Assessment: post-procedure vital signs reviewed and stable Respiratory status: spontaneous breathing, nonlabored ventilation, respiratory function stable and patient connected to nasal cannula oxygen Cardiovascular status: blood pressure returned to baseline and stable Postop Assessment: no apparent nausea or vomiting Anesthetic complications: no    Last Vitals:  Vitals:   11/17/17 1630 11/17/17 1714  BP: (!) 144/90 (!) 149/91  Pulse: 83   Resp: 16 16  Temp: 36.6 C 36.6 C  SpO2: 98%     Last Pain:  Vitals:   11/17/17 1714  TempSrc:   PainSc: 1                  Addysyn Fern S

## 2018-02-10 ENCOUNTER — Other Ambulatory Visit (INDEPENDENT_AMBULATORY_CARE_PROVIDER_SITE_OTHER): Payer: Self-pay | Admitting: Orthopaedic Surgery

## 2018-04-14 ENCOUNTER — Other Ambulatory Visit (INDEPENDENT_AMBULATORY_CARE_PROVIDER_SITE_OTHER): Payer: Self-pay | Admitting: Orthopaedic Surgery

## 2018-04-16 NOTE — Telephone Encounter (Signed)
refill 

## 2018-05-25 NOTE — Pre-Procedure Instructions (Signed)
Brett Humphrey  05/25/2018      [id number] Brett Humphrey- [place], [place] - [address] [place] [id number] Phone: [phone] Fax: [phone]    Your procedure is scheduled on Wednesday September 4.  [facility] Admitting at 8:00 A.M.  Call this number if you have problems the morning of surgery:  [phone]   Remember:  Do not eat or drink after midnight.    Take these medicines the morning of surgery with A SIP OF WATER:   Gabapentin (neurontin) Ranitidine (zantac) Venlafaxine (effexor) Tramadol (ultram) if needed  7 days prior to surgery STOP taking any Aspirin(unless otherwise instructed by your surgeon), Aleve, Naproxen, Ibuprofen, Motrin, Advil, Goody's, BC's, all herbal medications, fish oil, and all vitamins, Vascepa, meloxicam (mobic)      Do not wear jewelry  Do not wear lotions, powders, or colognes, or deodorant.  Do not shave 48 hours prior to surgery.  Men may shave face and neck.  Do not bring valuables to the hospital.  [facility] is not responsible for any belongings or valuables.  Contacts, dentures or bridgework may not be worn into surgery.  Leave your suitcase in the car.  After surgery it may be brought to your room.  For patients admitted to the hospital, discharge time will be determined by your treatment team.  Patients discharged the day of surgery will not be allowed to drive home.   Special instructions:    [place]- Preparing For Surgery  Before surgery, you can play an important role. Because skin is not sterile, your skin needs to be as free of germs as possible. You can reduce the number of germs on your skin by washing with CHG (chlorahexidine gluconate) Soap before surgery.  CHG is an antiseptic cleaner which kills germs and bonds with the skin to continue killing germs even after washing.    Oral Hygiene is also important to reduce your risk of infection.  Remember - BRUSH YOUR TEETH THE  MORNING OF SURGERY WITH YOUR REGULAR TOOTHPASTE  Please do not use if you have an allergy to CHG or antibacterial soaps. If your skin becomes reddened/irritated stop using the CHG.  Do not shave (including legs and underarms) for at least 48 hours prior to first CHG shower. It is OK to shave your face.  Please follow these instructions carefully.   1. Shower the NIGHT BEFORE SURGERY and the MORNING OF SURGERY with CHG.   2. If you chose to wash your hair, wash your hair first as usual with your normal shampoo.  3. After you shampoo, rinse your hair and body thoroughly to remove the shampoo.  4. Use CHG as you would any other liquid soap. You can apply CHG directly to the skin and wash gently with a scrungie or a clean washcloth.   5. Apply the CHG Soap to your body ONLY FROM THE NECK DOWN.  Do not use on open wounds or open sores. Avoid contact with your eyes, ears, mouth and genitals (private parts). Wash Face and genitals (private parts)  with your normal soap.  6. Wash thoroughly, paying special attention to the area where your surgery will be performed.  7. Thoroughly rinse your body with warm water from the neck down.  8. DO NOT shower/wash with your normal soap after using and rinsing off the CHG Soap.  9. Pat yourself dry with a CLEAN TOWEL.  10. Wear CLEAN PAJAMAS to bed the night  before surgery, wear comfortable clothes the morning of surgery  11. Place CLEAN SHEETS on your bed the night of your first shower and DO NOT SLEEP WITH PETS.    Day of Surgery:  Do not apply any deodorants/lotions.  Please wear clean clothes to the hospital/surgery center.   Remember to brush your teeth WITH YOUR REGULAR TOOTHPASTE.    Please read over the following fact sheets that you were given. Coughing and Deep Breathing, MRSA Information and Surgical Site Infection Prevention

## 2018-05-28 ENCOUNTER — Other Ambulatory Visit: Payer: Self-pay

## 2018-05-28 ENCOUNTER — Encounter (HOSPITAL_COMMUNITY): Payer: Self-pay

## 2018-05-28 ENCOUNTER — Encounter (HOSPITAL_COMMUNITY)
Admission: RE | Admit: 2018-05-28 | Discharge: 2018-05-28 | Disposition: A | Discharge status: Home / Self Care | Payer: Commercial Managed Care - PPO | Source: Ambulatory Visit | Attending: Orthopedic Surgery | Admitting: Orthopedic Surgery

## 2018-05-28 DIAGNOSIS — Z87891 Personal history of nicotine dependence: Secondary | ICD-10-CM | POA: Diagnosis not present

## 2018-05-28 DIAGNOSIS — G4733 Obstructive sleep apnea (adult) (pediatric): Secondary | ICD-10-CM | POA: Diagnosis not present

## 2018-05-28 DIAGNOSIS — Z01812 Encounter for preprocedural laboratory examination: Secondary | ICD-10-CM | POA: Diagnosis present

## 2018-05-28 DIAGNOSIS — K219 Gastro-esophageal reflux disease without esophagitis: Secondary | ICD-10-CM | POA: Insufficient documentation

## 2018-05-28 DIAGNOSIS — I10 Essential (primary) hypertension: Secondary | ICD-10-CM | POA: Diagnosis not present

## 2018-05-28 DIAGNOSIS — Z79899 Other long term (current) drug therapy: Secondary | ICD-10-CM | POA: Insufficient documentation

## 2018-05-28 DIAGNOSIS — M4712 Other spondylosis with myelopathy, cervical region: Secondary | ICD-10-CM | POA: Insufficient documentation

## 2018-05-28 DIAGNOSIS — E785 Hyperlipidemia, unspecified: Secondary | ICD-10-CM | POA: Insufficient documentation

## 2018-05-28 HISTORY — DX: Unspecified osteoarthritis, unspecified site: M19.90

## 2018-05-28 LAB — BASIC METABOLIC PANEL
Anion gap: 12 (ref 5–15)
BUN: 10 mg/dL (ref 6–20)
CALCIUM: 9.1 mg/dL (ref 8.9–10.3)
CO2: 27 mmol/L (ref 22–32)
CREATININE: 1.15 mg/dL (ref 0.61–1.24)
Chloride: 100 mmol/L (ref 98–111)
GFR calc Af Amer: >60 mL/min (ref >60)
GLUCOSE: 80 mg/dL (ref 70–99)
Potassium: 4.1 mmol/L (ref 3.5–5.1)
Sodium: 139 mmol/L (ref 135–145)

## 2018-05-28 LAB — TYPE AND SCREEN
ABO/RH(D): A POS
Antibody Screen: NEGATIVE

## 2018-05-28 LAB — SURGICAL PCR SCREEN
MRSA, PCR: NEGATIVE
Staphylococcus aureus: NEGATIVE

## 2018-05-28 LAB — CBC
HCT: 49.2 % (ref 39.0–52.0)
Hemoglobin: 15.5 g/dL (ref 13.0–17.0)
MCH: 30 pg (ref 26.0–34.0)
MCHC: 31.5 g/dL (ref 30.0–36.0)
MCV: 95.2 fL (ref 78.0–100.0)
PLATELETS: 256 10*3/uL (ref 150–400)
RBC: 5.17 MIL/uL (ref 4.22–5.81)
RDW: 13.4 % (ref 11.5–15.5)
WBC: 6 10*3/uL (ref 4.0–10.5)

## 2018-05-28 LAB — ABO/RH: ABO/RH(D): A POS

## 2018-05-28 NOTE — Progress Notes (Signed)
PCP -  Lolita CramAnna Decker PA  EKG - 11/14/2017  Sleep Study - 02/26/12 CPAP -  Bringing mask and hose   Blood Thinner Instructions: stop all 7 days prior to surgery  Anesthesia review: Per Dr. Shon BatonBrooks order  Patient denies shortness of breath, fever, cough and chest pain at PAT appointment   Patient verbalized understanding of instructions that were given to them at the PAT appointment. Patient was also instructed that they will need to review over the PAT instructions again at home before surgery.

## 2018-05-29 NOTE — Progress Notes (Signed)
Anesthesia Chart Review:  Case:  [id number] Date/Time:  06/06/18 0945   Procedure:  ANTERIOR CERVICAL DECOMPRESSION/DISCECTOMY FUSION C3-7 (N/A ) - 5 hrs   Anesthesia type:  General   Pre-op diagnosis:  Cervical spondylotic myelopathy   Location:  [facility] ROOM 04 / [facility] OR   Surgeon:  Venita Lick, MD    He is also scheduled for posterior spinal fusion interbody C3-7 on 06/07/2018.   DISCUSSION: Patient is a 57 year old male scheduled for the above procedures. History includes former smoker (quit '06), HTN, HLD, GERD, OSA (CPAP), panic disorder, rhinoplasty. S/p open right inguinal hernia repair 11/17/17.    He denied SOB, chest pain, cough, and fever at his PAT RN visit.   EKGs reviewed with anesthesiologist Val Eagle, MD. Patient tolerated surgery ~ 6 months ago. If he remains asymptomatic from a CV standpoint then it is anticipated that he can proceed as planned.   VS: BP 125/80   Pulse 83   Temp 36.9 C   Resp 20   Ht 5\' 10"  (1.778 m)   Wt 79.9 kg   SpO2 97%   BMI 25.28 kg/m   PROVIDERS: Wilfrid Lund, PA at [facility].  He does not see a cardiologist, but saw Armanda Magic, MD in 2014 for OSA.   LABS: Labs reviewed: Acceptable for surgery. (all labs ordered are listed, but only abnormal results are displayed)  Labs Reviewed  SURGICAL PCR SCREEN  CBC  BASIC METABOLIC PANEL  TYPE AND SCREEN  ABO/RH    EKG: 11/14/17 (pre-op EKG for inguinal hernia repair): NSR, incomplete left BBB. Overall, I don't think EKG is significantly changed when compared to 05/30/17 tracing from [facility] (scanned under Media tab).   CV: N/A   Past Medical History:  Diagnosis Date  . Arthritis    back  . ED (erectile dysfunction)   . Esophageal reflux   . GERD (gastroesophageal reflux disease)   . Hypercholesteremia   . Hyperlipidemia   . Hypertension   . Male hypogonadism   . OSA (obstructive sleep apnea)    Severe w AHI 37/hr now on CPAP at 14cm H2O   . Panic disorder without agoraphobia     Past Surgical History:  Procedure Laterality Date  . BACK SURGERY  06/12/2017   lumbar L4-5  . COLONOSCOPY    . EYE SURGERY     bilateral cataract with lens implants  . HERNIA REPAIR     right inguinal  . INGUINAL HERNIA REPAIR Right 11/17/2017   Procedure: OPEN RIGHT HERNIA REPAIR INGUINAL ADULT;  Surgeon: Axel Filler, MD;  Location: [facility] ORS;  Service: General;  Laterality: Right;  . INSERTION OF MESH Right 11/17/2017   Procedure: INSERTION OF MESH;  Surgeon: Axel Filler, MD;  Location: [facility]S;  Service: General;  Laterality: Right;  . RHINOPLASTY     20 years ago  . ROTATOR CUFF REPAIR     right shoulder Dr Magnus Ivan    MEDICATIONS: . atorvastatin (LIPITOR) 40 MG tablet  . ferrous sulfate 325 (65 FE) MG tablet  . gabapentin (NEURONTIN) 300 MG capsule  . losartan (COZAAR) 100 MG tablet  . meloxicam (MOBIC) 15 MG tablet  . Multiple Vitamin (MULTIVITAMIN) capsule  . nicotine (NICODERM CQ - DOSED IN MG/24 HOURS) 21 mg/24hr patch  . ranitidine (ZANTAC) 150 MG tablet  . Testosterone 30 MG/ACT SOLN  . traMADol (ULTRAM) 50 MG tablet  . vardenafil (LEVITRA) 10 MG tablet  . VASCEPA 1  g CAPS  . venlafaxine XR (EFFEXOR-XR) 75 MG 24 hr capsule  . zolpidem (AMBIEN) 10 MG tablet   No current facility-administered medications for this encounter.     Velna Ochsllison Doratha Mcswain, PA-C [facility] Phone [phone] 05/29/2018 2:58 PM

## 2018-05-29 NOTE — H&P (Addendum)
Patient ID: Nanine MeansKevin R Stlouis MRN: [id number] DOB/AGE: May 23, 1961 57 y.o.  Admit date: (Not on file)  Admission Diagnoses:  Cervical DDD  HPI: Pleasant 57 year old male pt presents to clinic for H& P prior to Anterior and posterior cervical fusionpt reports a hx of good health.  Past Medical History: Past Medical History:  Diagnosis Date  . Arthritis    back  . ED (erectile dysfunction)   . Esophageal reflux   . GERD (gastroesophageal reflux disease)   . Hypercholesteremia   . Hyperlipidemia   . Hypertension   . Male hypogonadism   . OSA (obstructive sleep apnea)    Severe w AHI 37/hr now on CPAP at 14cm H2O  . Panic disorder without agoraphobia     Surgical History: Past Surgical History:  Procedure Laterality Date  . BACK SURGERY  06/12/2017   lumbar L4-5  . COLONOSCOPY    . EYE SURGERY     bilateral cataract with lens implants  . HERNIA REPAIR     right inguinal  . INGUINAL HERNIA REPAIR Right 11/17/2017   Procedure: OPEN RIGHT HERNIA REPAIR INGUINAL ADULT;  Surgeon: Axel Filleramirez, Armando, MD;  Location: [facility];  Service: General;  Laterality: Right;  . INSERTION OF MESH Right 11/17/2017   Procedure: INSERTION OF MESH;  Surgeon: Axel Filleramirez, Armando, MD;  Location: [facility];  Service: General;  Laterality: Right;  . RHINOPLASTY     20 years ago  . ROTATOR CUFF REPAIR     right shoulder Dr Magnus IvanBlackman    Family History: Family History  Problem Relation Age of Onset  . Heart attack Father   . Cancer Mother   . Cancer Brother   . Asthma Sister     Social History: Social History   Socioeconomic History  . Marital status: Married    Spouse name: Not on file  . Number of children: Not on file  . Years of education: Not on file  . Highest education level: Not on file  Occupational History  . Not on file  Social Needs  . Financial resource strain: Not on file  . Food insecurity:    Worry: Not on file    Inability: Not on file  . Transportation needs:    Medical:  Not on file    Non-medical: Not on file  Tobacco Use  . Smoking status: Former Smoker    Types: Cigarettes    Last attempt to quit: 10/03/2004    Years since quitting: 13.6  . Smokeless tobacco: Never Used  Substance and Sexual Activity  . Alcohol use: Yes    Comment: 1 day a week  . Drug use: No  . Sexual activity: Not on file  Lifestyle  . Physical activity:    Days per week: Not on file    Minutes per session: Not on file  . Stress: Not on file  Relationships  . Social connections:    Talks on phone: Not on file    Gets together: Not on file    Attends religious service: Not on file    Active member of club or organization: Not on file    Attends meetings of clubs or organizations: Not on file    Relationship status: Not on file  . Intimate partner violence:    Fear of current or ex partner: Not on file    Emotionally abused: Not on file    Physically abused: Not on file    Forced sexual activity: Not on  file  Other Topics Concern  . Not on file  Social History Narrative  . Not on file    Allergies: Simvastatin  Medications: I have reviewed the patient's current medications.  Vital Signs: No data found.  Radiology: No results found.  Labs: Recent Labs    05/28/18 1441  WBC 6.0  RBC 5.17  HCT 49.2  PLT 256   Recent Labs    05/28/18 1441  NA 139  K 4.1  CL 100  CO2 27  BUN 10  CREATININE 1.15  GLUCOSE 80  CALCIUM 9.1   No results for input(s): LABPT, INR in the last 72 hours.  Review of Systems: ROS  Physical Exam: There is no height or weight on file to calculate BMI.  Physical Exam  Constitutional: He is oriented to person, place, and time. He appears well-developed and well-nourished.  Cardiovascular: Normal rate and regular rhythm.  Respiratory: Effort normal and breath sounds normal.  GI: Soft. Bowel sounds are normal.  Neurological: He is alert and oriented to person, place, and time.  Skin: Skin is warm and dry.  Psychiatric:  He has a normal mood and affect. His behavior is normal. Judgment and thought content normal.   Neck: Significant neck pain with palpation and range of motion.  Positive Spurling sign bilateral upper extremities.  Positive crepitus.  Poor range of motion due to severe pain. Neuro: 5 out of 5 strength in the upper and lower extremities bilaterally.  Positive dysesthesias and numbness primarily in both upper extremities.  1+ symmetrical deep tendon reflexes in the upper extremity bilaterally.  Right lower extremity: Brisk 3+ patellar reflex absent at the Achilles.  Left side: Patellar reflex 1+, absent at the Achilles. Reflexes: Hoffman: Positive Babinski: Unequivocal Gait: Patient has had difficulty maintaining his balance Musculoskeletal: Moderate pain with range of motion of the shoulder, elbow, wrist.  Imaging: MRI of the cervical spine was reviewed again: Patient has 4 level advanced degenerative disease of the disc, and facet joints.  There is also severe stenosis with cord signal change at C5-6.    Assessment and Plan: Risks and benefits of surgery were discussed with the patient. These include: Infection, bleeding, death, stroke, paralysis, ongoing or worse pain, need for additional surgery, nonunion, leak of spinal fluid, adjacent segment degeneration requiring additional fusion surgery. Pseudoarthrosis (nonunion)requiring supplemental posterior fixation. Throat pain, swallowing difficulties, hoarseness or change in voice.   With respect to disc replacement: Additional risks include heterotopic ossification, inability to place the disc due to technical issues requiring bailout to a fusion procedure.  Anette Riedel, PAC for Venita Lick, MD [facility] [phone]  Patient's clinical exam is essentially unchanged from his last office visit of 05/29/2018.  Patient has evidence of cervical spondylitic myelopathy as well as degenerative cervical disc disease with significant neck  pain.  As a result of the clinical and radiographic findings and the failure of conservative management we have elected to move forward with a 4 level anterior/posterior cervical fusion this is a planned two-stage procedure.  Today we will do the C3-7 ACDF.  More than likely he will remain intubated in the ICU for improved pain and airway management.  Tomorrow he will return to the operating room for a posterior C3-7 instrumentation and arthrodesis.  I have gone over the risks and benefits of surgery with the patient and his family and all of their questions were addressed.  These also include failure to improve ongoing or worse pain deterioration of his neurological  exam death, stroke, paralysis, infection, difficulty with swallowing, hoarseness or change in the voice, need for additional surgery.  Patient is expressed an understanding of the risks and benefits and we will proceed with the first stage of surgery today.

## 2018-06-06 ENCOUNTER — Inpatient Hospital Stay (HOSPITAL_COMMUNITY): Payer: Commercial Managed Care - PPO | Admitting: Anesthesiology

## 2018-06-06 ENCOUNTER — Inpatient Hospital Stay (HOSPITAL_COMMUNITY): Payer: Commercial Managed Care - PPO

## 2018-06-06 ENCOUNTER — Other Ambulatory Visit: Payer: Self-pay

## 2018-06-06 ENCOUNTER — Inpatient Hospital Stay (HOSPITAL_COMMUNITY)
Admission: RE | Admit: 2018-06-06 | Discharge: 2018-06-10 | DRG: 454 | Disposition: A | Discharge status: Home / Self Care | Payer: Commercial Managed Care - PPO | Attending: Orthopedic Surgery | Admitting: Orthopedic Surgery

## 2018-06-06 ENCOUNTER — Inpatient Hospital Stay (HOSPITAL_COMMUNITY): Payer: Commercial Managed Care - PPO | Admitting: Physician Assistant

## 2018-06-06 ENCOUNTER — Encounter (HOSPITAL_COMMUNITY): Payer: Self-pay | Admitting: *Deleted

## 2018-06-06 ENCOUNTER — Inpatient Hospital Stay (HOSPITAL_COMMUNITY)
Admission: RE | Disposition: A | Discharge status: Home / Self Care | Payer: Self-pay | Source: Home / Self Care | Attending: Orthopedic Surgery

## 2018-06-06 DIAGNOSIS — F410 Panic disorder [episodic paroxysmal anxiety] without agoraphobia: Secondary | ICD-10-CM | POA: Diagnosis present

## 2018-06-06 DIAGNOSIS — E785 Hyperlipidemia, unspecified: Secondary | ICD-10-CM | POA: Diagnosis present

## 2018-06-06 DIAGNOSIS — E7800 Pure hypercholesterolemia, unspecified: Secondary | ICD-10-CM | POA: Diagnosis present

## 2018-06-06 DIAGNOSIS — Z978 Presence of other specified devices: Secondary | ICD-10-CM | POA: Diagnosis not present

## 2018-06-06 DIAGNOSIS — Z87891 Personal history of nicotine dependence: Secondary | ICD-10-CM | POA: Diagnosis not present

## 2018-06-06 DIAGNOSIS — R0689 Other abnormalities of breathing: Secondary | ICD-10-CM

## 2018-06-06 DIAGNOSIS — M5001 Cervical disc disorder with myelopathy,  high cervical region: Secondary | ICD-10-CM | POA: Diagnosis present

## 2018-06-06 DIAGNOSIS — M542 Cervicalgia: Secondary | ICD-10-CM | POA: Diagnosis present

## 2018-06-06 DIAGNOSIS — Z419 Encounter for procedure for purposes other than remedying health state, unspecified: Secondary | ICD-10-CM

## 2018-06-06 DIAGNOSIS — M4712 Other spondylosis with myelopathy, cervical region: Secondary | ICD-10-CM | POA: Diagnosis present

## 2018-06-06 DIAGNOSIS — I10 Essential (primary) hypertension: Secondary | ICD-10-CM | POA: Diagnosis present

## 2018-06-06 DIAGNOSIS — G4733 Obstructive sleep apnea (adult) (pediatric): Secondary | ICD-10-CM | POA: Diagnosis present

## 2018-06-06 DIAGNOSIS — K219 Gastro-esophageal reflux disease without esophagitis: Secondary | ICD-10-CM | POA: Diagnosis present

## 2018-06-06 DIAGNOSIS — Z981 Arthrodesis status: Secondary | ICD-10-CM

## 2018-06-06 DIAGNOSIS — M5030 Other cervical disc degeneration, unspecified cervical region: Secondary | ICD-10-CM | POA: Diagnosis present

## 2018-06-06 HISTORY — PX: ANTERIOR CERVICAL DECOMPRESSION/DISCECTOMY FUSION 4 LEVELS: SHX5556

## 2018-06-06 LAB — POCT I-STAT 3, ART BLOOD GAS (G3+)
ACID-BASE EXCESS: 2 mmol/L (ref 0.0–2.0)
Bicarbonate: 27.9 mmol/L (ref 20.0–28.0)
O2 Saturation: 100 %
TCO2: 29 mmol/L (ref 22–32)
pCO2 arterial: 47.7 mmHg (ref 32.0–48.0)
pH, Arterial: 7.374 (ref 7.350–7.450)
pO2, Arterial: 239 mmHg — ABNORMAL HIGH (ref 83.0–108.0)

## 2018-06-06 SURGERY — ANTERIOR CERVICAL DECOMPRESSION/DISCECTOMY FUSION 4 LEVELS
Anesthesia: General | Site: Spine Cervical

## 2018-06-06 MED ORDER — MIDAZOLAM HCL 2 MG/2ML IJ SOLN
INTRAMUSCULAR | Status: AC
  Filled 2018-06-06: qty 2

## 2018-06-06 MED ORDER — ONDANSETRON HCL 4 MG/2ML IJ SOLN: 4.0000 mg | Freq: Four times a day (QID) | INTRAMUSCULAR | Status: DC | PRN

## 2018-06-06 MED ORDER — PROPOFOL 10 MG/ML IV BOLUS
INTRAVENOUS | Status: DC | PRN
  Administered 2018-06-06: 170 mg via INTRAVENOUS

## 2018-06-06 MED ORDER — MIDAZOLAM HCL 5 MG/5ML IJ SOLN
INTRAMUSCULAR | Status: DC | PRN
  Administered 2018-06-06: 2 mg via INTRAVENOUS

## 2018-06-06 MED ORDER — ONDANSETRON HCL 4 MG/2ML IJ SOLN
INTRAMUSCULAR | Status: AC
  Filled 2018-06-06: qty 2

## 2018-06-06 MED ORDER — DEXAMETHASONE SODIUM PHOSPHATE 10 MG/ML IJ SOLN
INTRAMUSCULAR | Status: AC
  Filled 2018-06-06: qty 1

## 2018-06-06 MED ORDER — FENTANYL BOLUS VIA INFUSION
50.0000 ug | INTRAVENOUS | Status: DC | PRN
  Filled 2018-06-06: qty 50

## 2018-06-06 MED ORDER — MINERAL OIL LIGHT 100 % EX OIL
TOPICAL_OIL | CUTANEOUS | Status: DC | PRN
  Administered 2018-06-06: 1 via TOPICAL

## 2018-06-06 MED ORDER — PROPOFOL 10 MG/ML IV BOLUS
INTRAVENOUS | Status: AC
  Filled 2018-06-06: qty 20

## 2018-06-06 MED ORDER — THROMBIN 20000 UNITS EX KIT
PACK | CUTANEOUS | Status: AC
  Filled 2018-06-06: qty 1

## 2018-06-06 MED ORDER — PROPOFOL 1000 MG/100ML IV EMUL
5.0000 ug/kg/min | INTRAVENOUS | Status: DC
  Administered 2018-06-06 – 2018-06-07 (×2): 50 ug/kg/min via INTRAVENOUS
  Filled 2018-06-06 (×3): qty 100

## 2018-06-06 MED ORDER — MAGNESIUM CITRATE PO SOLN: 1.0000 | Freq: Once | ORAL | Status: DC | PRN

## 2018-06-06 MED ORDER — POLYETHYLENE GLYCOL 3350 17 G PO PACK: 17.0000 g | PACK | Freq: Every day | ORAL | Status: DC | PRN

## 2018-06-06 MED ORDER — SUCCINYLCHOLINE CHLORIDE 200 MG/10ML IV SOSY
PREFILLED_SYRINGE | INTRAVENOUS | Status: AC
  Filled 2018-06-06: qty 10

## 2018-06-06 MED ORDER — FENTANYL CITRATE (PF) 100 MCG/2ML IJ SOLN
INTRAMUSCULAR | Status: DC | PRN
  Administered 2018-06-06 (×3): 50 ug via INTRAVENOUS
  Administered 2018-06-06: 150 ug via INTRAVENOUS
  Administered 2018-06-06 (×2): 50 ug via INTRAVENOUS

## 2018-06-06 MED ORDER — SODIUM CHLORIDE 0.9% FLUSH: 3.0000 mL | INTRAVENOUS | Status: DC | PRN

## 2018-06-06 MED ORDER — BUPIVACAINE-EPINEPHRINE 0.25% -1:200000 IJ SOLN
INTRAMUSCULAR | Status: DC | PRN
  Administered 2018-06-06: 10 mL

## 2018-06-06 MED ORDER — 0.9 % SODIUM CHLORIDE (POUR BTL) OPTIME
TOPICAL | Status: DC | PRN
  Administered 2018-06-06 (×4): 1000 mL

## 2018-06-06 MED ORDER — ACETAMINOPHEN 10 MG/ML IV SOLN
1000.0000 mg | INTRAVENOUS | Status: AC
  Administered 2018-06-06: 1000 mg via INTRAVENOUS
  Filled 2018-06-06: qty 100

## 2018-06-06 MED ORDER — LIDOCAINE 2% (20 MG/ML) 5 ML SYRINGE
INTRAMUSCULAR | Status: DC | PRN
  Administered 2018-06-06: 100 mg via INTRAVENOUS

## 2018-06-06 MED ORDER — ATORVASTATIN CALCIUM 40 MG PO TABS
40.0000 mg | ORAL_TABLET | Freq: Every day | ORAL | Status: DC
  Administered 2018-06-08 – 2018-06-10 (×3): 40 mg via ORAL
  Filled 2018-06-06 (×4): qty 1

## 2018-06-06 MED ORDER — SODIUM CHLORIDE 0.9 % IV SOLN
INTRAVENOUS | Status: DC | PRN
  Administered 2018-06-06: 5 ug/min via INTRAVENOUS

## 2018-06-06 MED ORDER — SUCCINYLCHOLINE CHLORIDE 20 MG/ML IJ SOLN
INTRAMUSCULAR | Status: DC | PRN
  Administered 2018-06-06: 140 mg via INTRAVENOUS

## 2018-06-06 MED ORDER — LACTATED RINGERS IV SOLN
INTRAVENOUS | Status: DC
  Administered 2018-06-06 (×2): via INTRAVENOUS

## 2018-06-06 MED ORDER — FENTANYL 2500MCG IN NS 250ML (10MCG/ML) PREMIX INFUSION
25.0000 ug/h | INTRAVENOUS | Status: DC
  Administered 2018-06-06: 50 ug/h via INTRAVENOUS
  Filled 2018-06-06: qty 250

## 2018-06-06 MED ORDER — CEFAZOLIN SODIUM-DEXTROSE 2-4 GM/100ML-% IV SOLN
2.0000 g | INTRAVENOUS | Status: DC
  Filled 2018-06-06: qty 100

## 2018-06-06 MED ORDER — MORPHINE SULFATE (PF) 2 MG/ML IV SOLN: 2.0000 mg | INTRAVENOUS | Status: DC | PRN

## 2018-06-06 MED ORDER — FENTANYL CITRATE (PF) 250 MCG/5ML IJ SOLN
INTRAMUSCULAR | Status: AC
  Filled 2018-06-06: qty 5

## 2018-06-06 MED ORDER — THROMBIN 20000 UNITS EX SOLR
CUTANEOUS | Status: DC | PRN
  Administered 2018-06-06: 20 mL via TOPICAL

## 2018-06-06 MED ORDER — LACTATED RINGERS IV SOLN
INTRAVENOUS | Status: DC | PRN
  Administered 2018-06-06: 09:00:00 via INTRAVENOUS

## 2018-06-06 MED ORDER — ONDANSETRON HCL 4 MG PO TABS: 4.0000 mg | ORAL_TABLET | Freq: Four times a day (QID) | ORAL | Status: DC | PRN

## 2018-06-06 MED ORDER — NICOTINE 21 MG/24HR TD PT24
21.0000 mg | MEDICATED_PATCH | Freq: Every day | TRANSDERMAL | Status: DC
  Administered 2018-06-07 – 2018-06-08 (×2): 21 mg via TRANSDERMAL
  Filled 2018-06-06 (×2): qty 1

## 2018-06-06 MED ORDER — LACTATED RINGERS IV SOLN
INTRAVENOUS | Status: DC
  Administered 2018-06-06 – 2018-06-07 (×2): via INTRAVENOUS

## 2018-06-06 MED ORDER — CEFAZOLIN SODIUM-DEXTROSE 2-4 GM/100ML-% IV SOLN
2.0000 g | INTRAVENOUS | Status: AC
  Administered 2018-06-06 (×2): 2 g via INTRAVENOUS
  Filled 2018-06-06: qty 100

## 2018-06-06 MED ORDER — ROCURONIUM BROMIDE 10 MG/ML (PF) SYRINGE
PREFILLED_SYRINGE | INTRAVENOUS | Status: DC | PRN
  Administered 2018-06-06: 50 mg via INTRAVENOUS

## 2018-06-06 MED ORDER — PROPOFOL 500 MG/50ML IV EMUL
INTRAVENOUS | Status: DC | PRN
  Administered 2018-06-06: 75 ug/kg/min via INTRAVENOUS
  Administered 2018-06-06: 13:00:00 via INTRAVENOUS

## 2018-06-06 MED ORDER — MINERAL OIL LIGHT 100 % EX OIL
TOPICAL_OIL | CUTANEOUS | Status: AC
  Filled 2018-06-06: qty 25

## 2018-06-06 MED ORDER — VENLAFAXINE HCL ER 75 MG PO CP24
75.0000 mg | ORAL_CAPSULE | Freq: Every day | ORAL | Status: DC
  Administered 2018-06-08 – 2018-06-10 (×3): 75 mg via ORAL
  Filled 2018-06-06 (×4): qty 1

## 2018-06-06 MED ORDER — PHENOL 1.4 % MT LIQD: 1.0000 | OROMUCOSAL | Status: DC | PRN

## 2018-06-06 MED ORDER — GABAPENTIN 300 MG PO CAPS
300.0000 mg | ORAL_CAPSULE | Freq: Three times a day (TID) | ORAL | Status: DC
  Administered 2018-06-07 – 2018-06-10 (×9): 300 mg via ORAL
  Filled 2018-06-06 (×9): qty 1

## 2018-06-06 MED ORDER — FENTANYL CITRATE (PF) 100 MCG/2ML IJ SOLN
50.0000 ug | Freq: Once | INTRAMUSCULAR | Status: AC
  Administered 2018-06-06: 50 ug via INTRAVENOUS
  Filled 2018-06-06: qty 2

## 2018-06-06 MED ORDER — INFLUENZA VAC SPLIT QUAD 0.5 ML IM SUSY: 0.5000 mL | PREFILLED_SYRINGE | INTRAMUSCULAR | Status: DC

## 2018-06-06 MED ORDER — ROCURONIUM BROMIDE 50 MG/5ML IV SOSY
PREFILLED_SYRINGE | INTRAVENOUS | Status: AC
  Filled 2018-06-06: qty 5

## 2018-06-06 MED ORDER — CEFAZOLIN SODIUM-DEXTROSE 2-4 GM/100ML-% IV SOLN
2.0000 g | Freq: Three times a day (TID) | INTRAVENOUS | Status: AC
  Administered 2018-06-06 – 2018-06-07 (×2): 2 g via INTRAVENOUS
  Filled 2018-06-06 (×2): qty 100

## 2018-06-06 MED ORDER — LIDOCAINE 2% (20 MG/ML) 5 ML SYRINGE
INTRAMUSCULAR | Status: AC
  Filled 2018-06-06: qty 5

## 2018-06-06 MED ORDER — TRANEXAMIC ACID 1000 MG/10ML IV SOLN
1000.0000 mg | INTRAVENOUS | Status: AC
  Administered 2018-06-06: 1000 mg via INTRAVENOUS
  Filled 2018-06-06: qty 1000

## 2018-06-06 MED ORDER — ONDANSETRON HCL 4 MG/2ML IJ SOLN
INTRAMUSCULAR | Status: DC | PRN
  Administered 2018-06-06: 4 mg via INTRAVENOUS

## 2018-06-06 MED ORDER — SODIUM CHLORIDE 0.9% FLUSH
3.0000 mL | Freq: Two times a day (BID) | INTRAVENOUS | Status: DC
  Administered 2018-06-10: 3 mL via INTRAVENOUS

## 2018-06-06 MED ORDER — DOCUSATE SODIUM 100 MG PO CAPS: 100.0000 mg | ORAL_CAPSULE | Freq: Two times a day (BID) | ORAL | Status: DC

## 2018-06-06 MED ORDER — DEXAMETHASONE SODIUM PHOSPHATE 10 MG/ML IJ SOLN
INTRAMUSCULAR | Status: DC | PRN
  Administered 2018-06-06: 10 mg via INTRAVENOUS

## 2018-06-06 MED ORDER — HEMOSTATIC AGENTS (NO CHARGE) OPTIME
TOPICAL | Status: DC | PRN
  Administered 2018-06-06 (×2): 1 via TOPICAL

## 2018-06-06 MED ORDER — SODIUM CHLORIDE 0.9 % IV SOLN: 250.0000 mL | INTRAVENOUS | Status: DC

## 2018-06-06 MED ORDER — MENTHOL 3 MG MT LOZG: 1.0000 | LOZENGE | OROMUCOSAL | Status: DC | PRN

## 2018-06-06 MED ORDER — BUPIVACAINE-EPINEPHRINE (PF) 0.25% -1:200000 IJ SOLN
INTRAMUSCULAR | Status: AC
  Filled 2018-06-06: qty 30

## 2018-06-06 MED ORDER — LOSARTAN POTASSIUM 50 MG PO TABS
100.0000 mg | ORAL_TABLET | Freq: Every day | ORAL | Status: DC
  Administered 2018-06-07 – 2018-06-10 (×4): 100 mg via ORAL
  Filled 2018-06-06 (×4): qty 2

## 2018-06-06 SURGICAL SUPPLY — 82 items
ADH SKN CLS APL DERMABOND .7 (GAUZE/BANDAGES/DRESSINGS) ×1
BLADE CLIPPER SURG (BLADE) IMPLANT
BONE VIVIGEN FORMABLE 5.4CC (Bone Implant) ×2 IMPLANT
BUR EGG ELITE 4.0 (BURR) IMPLANT
BUR MATCHSTICK NEURO 3.0 LAGG (BURR) IMPLANT
CABLE BIPOLOR RESECTION CORD (MISCELLANEOUS) ×2 IMPLANT
CAGE SPNL 6D 14XMED 16X7X (Cage) ×2 IMPLANT
CANISTER SUCT 3000ML PPV (MISCELLANEOUS) ×2 IMPLANT
CLSR STERI-STRIP ANTIMIC 1/2X4 (GAUZE/BANDAGES/DRESSINGS) ×2 IMPLANT
COLLAR CERV LO CONTOUR FIRM DE (SOFTGOODS) IMPLANT
COVER MAYO STAND STRL (DRAPES) ×6 IMPLANT
COVER SURGICAL LIGHT HANDLE (MISCELLANEOUS) ×2 IMPLANT
CRADLE DONUT ADULT HEAD (MISCELLANEOUS) ×2 IMPLANT
DERMABOND ADVANCED (GAUZE/BANDAGES/DRESSINGS) ×1
DERMABOND ADVANCED .7 DNX12 (GAUZE/BANDAGES/DRESSINGS) ×1 IMPLANT
DEVICE ENDSKLTN IMPL 16X14X7X6 (Cage) ×1 IMPLANT
DEVICE ENDSKLTN TC NANOLCK 6MM (Cage) ×2 IMPLANT
DEVICE FUSION NANLCK MED 6 6D (Cage) ×2 IMPLANT
DRAPE C-ARM 42X72 X-RAY (DRAPES) ×2 IMPLANT
DRAPE INCISE IOBAN 66X45 STRL (DRAPES) ×2 IMPLANT
DRAPE MICROSCOPE LEICA 46X105 (MISCELLANEOUS) IMPLANT
DRAPE POUCH INSTRU U-SHP 10X18 (DRAPES) ×2 IMPLANT
DRAPE SURG 17X23 STRL (DRAPES) ×2 IMPLANT
DRAPE U-SHAPE 47X51 STRL (DRAPES) ×2 IMPLANT
DRSG OPSITE POSTOP 4X6 (GAUZE/BANDAGES/DRESSINGS) ×2 IMPLANT
DURAPREP 26ML APPLICATOR (WOUND CARE) ×2 IMPLANT
ELECT COATED BLADE 2.86 ST (ELECTRODE) ×2 IMPLANT
ELECT PENCIL ROCKER SW 15FT (MISCELLANEOUS) ×2 IMPLANT
ELECT REM PT RETURN 9FT ADLT (ELECTROSURGICAL) ×2
ELECTRODE REM PT RTRN 9FT ADLT (ELECTROSURGICAL) ×1 IMPLANT
ENDOSKELETON IMPLANT 16X14X7X6 (Cage) ×2 IMPLANT
ENDOSKELETON TC NANOLOCK 6MM (Cage) ×4 IMPLANT
FEE INTRAOP MONITOR IMPULS NCS (MISCELLANEOUS) ×1 IMPLANT
FLOSEAL 10ML (HEMOSTASIS) ×4 IMPLANT
FUSION NANOLOCK MED 6 6D (Cage) ×4 IMPLANT
FUSION TCS NANOLOCK 7MM 6DEG (Cage) ×2 IMPLANT
GLOVE BIO SURGEON STRL SZ 6.5 (GLOVE) ×2 IMPLANT
GLOVE BIOGEL PI IND STRL 6.5 (GLOVE) ×1 IMPLANT
GLOVE BIOGEL PI IND STRL 8.5 (GLOVE) ×1 IMPLANT
GLOVE BIOGEL PI INDICATOR 6.5 (GLOVE) ×1
GLOVE BIOGEL PI INDICATOR 8.5 (GLOVE) ×1
GLOVE SS BIOGEL STRL SZ 8.5 (GLOVE) ×1 IMPLANT
GLOVE SUPERSENSE BIOGEL SZ 8.5 (GLOVE) ×1
GOWN STRL REUS W/ TWL LRG LVL3 (GOWN DISPOSABLE) ×2 IMPLANT
GOWN STRL REUS W/TWL 2XL LVL3 (GOWN DISPOSABLE) ×4 IMPLANT
GOWN STRL REUS W/TWL LRG LVL3 (GOWN DISPOSABLE) ×4
INTRAOP MONITOR FEE IMPULS NCS (MISCELLANEOUS) ×1
INTRAOP MONITOR FEE IMPULSE (MISCELLANEOUS) ×1
KIT BASIN OR (CUSTOM PROCEDURE TRAY) ×2 IMPLANT
KIT TURNOVER KIT B (KITS) ×2 IMPLANT
NEEDLE HYPO 22GX1.5 SAFETY (NEEDLE) ×2 IMPLANT
NEEDLE SPNL 18GX3.5 QUINCKE PK (NEEDLE) ×2 IMPLANT
NS IRRIG 1000ML POUR BTL (IV SOLUTION) ×8 IMPLANT
PACK ORTHO CERVICAL (CUSTOM PROCEDURE TRAY) ×2 IMPLANT
PACK UNIVERSAL I (CUSTOM PROCEDURE TRAY) ×2 IMPLANT
PAD ARMBOARD 7.5X6 YLW CONV (MISCELLANEOUS) ×4 IMPLANT
PATTIES SURGICAL .25X.25 (GAUZE/BANDAGES/DRESSINGS) IMPLANT
PATTIES SURGICAL .5 X.5 (GAUZE/BANDAGES/DRESSINGS) IMPLANT
PIN DISTRACTION 14 (PIN) ×2 IMPLANT
RUBBERBAND STERILE (MISCELLANEOUS) IMPLANT
SCREW LOCKING 14MMX3.5MM (Screw) ×8 IMPLANT
SPONGE INTESTINAL PEANUT (DISPOSABLE) ×2 IMPLANT
SPONGE LAP 4X18 RFD (DISPOSABLE) ×2 IMPLANT
SPONGE SURGIFOAM ABS GEL 100 (HEMOSTASIS) ×2 IMPLANT
SUT BONE WAX W31G (SUTURE) ×2 IMPLANT
SUT MON AB 3-0 SH 27 (SUTURE) ×2
SUT MON AB 3-0 SH27 (SUTURE) ×1 IMPLANT
SUT SILK 2 0 (SUTURE) ×2
SUT SILK 2-0 18XBRD TIE 12 (SUTURE) ×1 IMPLANT
SUT VIC AB 1 CT1 18XCR BRD 8 (SUTURE) IMPLANT
SUT VIC AB 1 CT1 8-18 (SUTURE)
SUT VIC AB 2-0 CT1 18 (SUTURE) ×4 IMPLANT
SUT VIC AB 3-0 54X BRD REEL (SUTURE) IMPLANT
SUT VIC AB 3-0 BRD 54 (SUTURE)
SYR BULB IRRIGATION 50ML (SYRINGE) ×2 IMPLANT
SYR CONTROL 10ML LL (SYRINGE) ×4 IMPLANT
TAPE CLOTH 4X10 WHT NS (GAUZE/BANDAGES/DRESSINGS) ×2 IMPLANT
TAPE UMBILICAL COTTON 1/8X30 (MISCELLANEOUS) ×2 IMPLANT
TOWEL GREEN STERILE (TOWEL DISPOSABLE) ×2 IMPLANT
TOWEL GREEN STERILE FF (TOWEL DISPOSABLE) ×2 IMPLANT
TRAY FOLEY MTR SLVR 16FR STAT (SET/KITS/TRAYS/PACK) ×2 IMPLANT
WATER STERILE IRR 1000ML POUR (IV SOLUTION) IMPLANT

## 2018-06-06 NOTE — Anesthesia Procedure Notes (Signed)
Arterial Line Insertion Start/End9/01/2018 9:10 AM, 06/06/2018 9:20 AM Performed by: Rosiland Oz, CRNA, CRNA  Patient location: Pre-op. Preanesthetic checklist: patient identified, IV checked, site marked, risks and benefits discussed, surgical consent, monitors and equipment checked, pre-op evaluation, timeout performed and anesthesia consent Lidocaine 1% used for infiltration Left, radial was placed Catheter size: 20 G Hand hygiene performed , maximum sterile barriers used  and Seldinger technique used  Attempts: 1 Procedure performed without using ultrasound guided technique. Following insertion, dressing applied and Biopatch. Post procedure assessment: normal  Patient tolerated the procedure well with no immediate complications.

## 2018-06-06 NOTE — Brief Op Note (Signed)
06/06/2018  4:43 PM  PATIENT:  Brett Humphrey  57 y.o. male  PRE-OPERATIVE DIAGNOSIS:  Cervical spondylotic myelopathy  POST-OPERATIVE DIAGNOSIS:  Cervical spondylotic myelopathy  PROCEDURE:  Procedure(s) with comments: ANTERIOR CERVICAL DECOMPRESSION/DISCECTOMY FUSION C3-7 (N/A) - 5 hrs  SURGEON:  Surgeon(s) and Role:    Venita Lick, MD - Primary  PHYSICIAN ASSISTANT:   ASSISTANTS: Carmen Mayo   ANESTHESIA:   general  EBL:  100 mL   BLOOD ADMINISTERED:none  DRAINS: none   LOCAL MEDICATIONS USED:  MARCAINE     SPECIMEN:  No Specimen  DISPOSITION OF SPECIMEN:  N/A  COUNTS:  YES  TOURNIQUET:  * No tourniquets in log *  DICTATION: .Dragon Dictation  PLAN OF CARE: Admit to inpatient   PATIENT DISPOSITION:  ICU - intubated and hemodynamically stable.

## 2018-06-06 NOTE — Progress Notes (Signed)
Pt placed on charted vent settings until CCM can come eval pt and write orders.  VSS, sat 100%, [facility] clear coarse.

## 2018-06-06 NOTE — Consult Note (Signed)
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  SIGNIFICANT EVENTS  Patient had anterior cervical discectomy and fusion C3-C7     HISTORY OF PRESENT ILLNESS: Patient is a 57 year old Caucasian male with past medical history of hyperlipidemia hypertension obstructive sleep apnea here for elective surgery for degenerative disc disease anterior approach C3-C7 fusion and tomorrow for posterior approach we were asked by the neurosurgical services to manage the vent and the morning till pain goes back to the OR, so the patient he was awakened from anesthesia he was on propofol at this point we will continue him on propofol and fentanyl.  There was no family by the bedside.  PAST MEDICAL HISTORY :   has a past medical history of Arthritis, ED (erectile dysfunction), Esophageal reflux, GERD (gastroesophageal reflux disease), Hypercholesteremia, Hyperlipidemia, Hypertension, Male hypogonadism, OSA (obstructive sleep apnea), and Panic disorder without agoraphobia.  has a past surgical history that includes Rotator cuff repair; Back surgery (06/12/2017); Eye surgery; Rhinoplasty; Hernia repair; Inguinal hernia repair (Right, 11/17/2017); Insertion of mesh (Right, 11/17/2017); and Colonoscopy. Prior to Admission medications   Medication Sig Start Date End Date Taking? Authorizing Provider  atorvastatin (LIPITOR) 40 MG tablet Take 40 mg by mouth daily. 09/14/17  Yes [provider]  ferrous sulfate 325 (65 FE) MG tablet Take 325 mg by  mouth daily with breakfast.   Yes [provider]  gabapentin (NEURONTIN) 300 MG capsule Take 300 mg by mouth 3 (three) times daily. 05/07/18  Yes [provider]  losartan (COZAAR) 100 MG tablet Take 100 mg by mouth daily. 09/22/17  Yes [provider]  meloxicam (MOBIC) 15 MG tablet TAKE 1 TABLET (15 MG TOTAL) BY MOUTH ONCE AS NEEDED FOR UP TO 1 DOSE FOR PAIN. Patient taking differently: Take 15 mg by mouth daily.  04/16/18  Yes Kathryne Hitch, MD  Multiple Vitamin (MULTIVITAMIN) capsule Take 1 capsule by mouth daily.   Yes [provider]  nicotine (NICODERM CQ - DOSED IN MG/24 HOURS) 21 mg/24hr patch Place 21 mg onto the skin daily.   Yes [provider]  ranitidine (ZANTAC) 150 MG tablet Take 150 mg by mouth 2 (two) times daily. 09/05/17  Yes [provider]  Testosterone 30 MG/ACT SOLN Apply 1 application topically daily. 10/20/17  Yes [provider]  traMADol (ULTRAM) 50 MG tablet Take 50 mg by mouth every 6 (six) hours as needed for moderate pain or severe pain.  11/08/17  Yes [provider]  VASCEPA 1 g CAPS Take 2 g by mouth 2 (two) times daily. 10/12/17  Yes [provider]  venlafaxine XR (EFFEXOR-XR) 75 MG 24 hr capsule Take 75 mg by mouth daily with breakfast.   Yes [provider]  zolpidem (AMBIEN) 10 MG tablet Take 10 mg by mouth at bedtime.    Yes [provider]  vardenafil (LEVITRA) 10 MG tablet Take 20 mg by mouth daily as needed for erectile dysfunction.     [provider]   Allergies  Allergen Reactions  . Simvastatin      myalgias     FAMILY HISTORY:  family history includes Asthma in his sister; Cancer in his brother and mother; Heart attack in his father. SOCIAL HISTORY:  reports that he quit smoking about 13 years ago. His smoking use included cigarettes. He has never used smokeless tobacco. He reports that he drinks alcohol. He reports that he does not  use drugs.  REVIEW OF SYSTEMS:   Constitutional: Negative for fever, chills, weight loss, malaise/fatigue and diaphoresis.  HENT: Negative for hearing loss, ear pain, nosebleeds, congestion, sore throat, neck pain, tinnitus and ear discharge.   Eyes: Negative for blurred vision, double vision, photophobia, pain, discharge and redness.  Respiratory: Negative for cough, hemoptysis, sputum production, shortness of breath, wheezing and stridor.   Cardiovascular: Negative for chest pain, palpitations, orthopnea, claudication, leg swelling and PND.  Gastrointestinal: Negative for heartburn, nausea, vomiting, abdominal pain, diarrhea, constipation, blood in stool and melena.  Genitourinary: Negative for dysuria, urgency, frequency, hematuria and flank pain.  Musculoskeletal: Negative for myalgias, back pain, joint pain and falls.  Skin: Negative for itching and rash.  Neurological: Negative for dizziness, tingling, tremors, sensory change, speech change, focal weakness, seizures, loss of consciousness, weakness and headaches.  Endo/Heme/Allergies: Negative for environmental allergies and polydipsia. Does not bruise/bleed easily.  SUBJECTIVE:   VITAL SIGNS: Temp:  [97.7 F (36.5 C)] 97.7 F (36.5 C) (09/04 0754) Pulse Rate:  [78-84] 84 (09/04 1718) Resp:  [16-20] 16 (09/04 1718) BP: (142-145)/(68-102) 145/68 (09/04 1718) SpO2:  [95 %-99 %] 99 % (09/04 1718) FiO2 (%):  [40 %-60 %] 40 % (09/04 1737) Weight:  [79.9 kg] 79.9 kg (09/04 0808)  PHYSICAL EXAMINATION: General: Intubated sedated interactive with a 50 of propofol at this point patient is moving upper lower extremities without limitations.. Neuro:  WNL , AOX3 , EOMI , CN II-XII intact , UL , LL strength is symmetrical and 5/5 HEENT:  atraumatic , no jaundice , dry mucous membranes  Cardiovascular:  Irregular irregular , ESM 2/6 in the aortic area  Lungs:  CTA bilateral , no wheezing or crackles  Abdomen:  Soft lax +BS , no tenderness  . Musculoskeletal:  WNL , normal pulses  Skin:  No rash    No results for input(s): NA, K, CL, CO2, BUN, CREATININE, GLUCOSE in the last 168 hours. No results for input(s): HGB, HCT, WBC, PLT in the last 168 hours. No results found.  ASSESSMENT / PLAN:  This is a postop management consultation for ventilator use surgery is postoperative anterior disc approach for discectomy and fusion of C3-C7 this point continue sedation ABG is reassuring we will shoot an x-ray to make sure ET tube is in the right place but SCDs avoid antiplatelets antithrombotic at this point in regards of H2 blockers or PPI will consider that after 48 hours of intubation but anticipate the patient will have no difficulties getting extubated when he is done with all of his surgical plans vent was adjusted based on the ABG he is a full code and therefore sedation with propofol and fentanyl thank you for this consultation please call us with any questions  Pulmonary and Critical Care Medicine [facility] Pager: [phone]  06/06/2018, 6:01 PM

## 2018-06-06 NOTE — Anesthesia Procedure Notes (Signed)
Procedure Name: Intubation Date/Time: 06/06/2018 10:55 AM Performed by: Lovie Chol, CRNA Pre-anesthesia Checklist: Patient identified, Emergency Drugs available, Suction available and Patient being monitored Patient Re-evaluated:Patient Re-evaluated prior to induction Oxygen Delivery Method: Circle System Utilized Preoxygenation: Pre-oxygenation with 100% oxygen Induction Type: IV induction Ventilation: Mask ventilation without difficulty Laryngoscope Size: Glidescope and 4 Grade View: Grade I Tube type: Subglottic suction tube Tube size: 8.0 mm Number of attempts: 1 Airway Equipment and Method: Stylet and Oral airway Placement Confirmation: ETT inserted through vocal cords under direct vision,  positive ETCO2 and breath sounds checked- equal and bilateral Secured at: 22 (@ teeth) cm Tube secured with: Tape Dental Injury: Teeth and Oropharynx as per pre-operative assessment

## 2018-06-06 NOTE — Anesthesia Preprocedure Evaluation (Addendum)
Anesthesia Evaluation  Patient identified by MRN, date of birth, ID band Patient awake    Reviewed: Allergy & Precautions, NPO status , Patient's Chart, lab work & pertinent test results  Airway Mallampati: II  TM Distance: >3 FB Neck ROM: Full    Dental  (+) Teeth Intact, Dental Advisory Given Patient has full set of braces on upper and lower teeth. No brackets or teeth loose per patient.:   Pulmonary sleep apnea and Continuous Positive Airway Pressure Ventilation , former smoker,    breath sounds clear to auscultation       Cardiovascular hypertension, Pt. on medications  Rhythm:Regular Rate:Normal     Neuro/Psych    GI/Hepatic Neg liver ROS, GERD  Medicated and Controlled,  Endo/Other  negative endocrine ROS  Renal/GU negative Renal ROS     Musculoskeletal  (+) Arthritis ,   Abdominal   Peds  Hematology   Anesthesia Other Findings   Reproductive/Obstetrics                           Anesthesia Physical Anesthesia Plan  ASA: II  Anesthesia Plan: General   Post-op Pain Management:    Induction: Intravenous  PONV Risk Score and Plan: 2 and Ondansetron and Treatment may vary due to age or medical condition  Airway Management Planned: Oral ETT  Additional Equipment: Arterial line  Intra-op Plan:   Post-operative Plan: Post-operative intubation/ventilation  Informed Consent: I have reviewed the patients History and Physical, chart, labs and discussed the procedure including the risks, benefits and alternatives for the proposed anesthesia with the patient or authorized representative who has indicated his/her understanding and acceptance.   Dental advisory given  Plan Discussed with: CRNA, Anesthesiologist and Surgeon  Anesthesia Plan Comments: (Per surgeon's request will leave patient intubated overnight for posterior surgery tomorrow. A line and 2 PIVs)       Anesthesia  Quick Evaluation

## 2018-06-06 NOTE — Transfer of Care (Signed)
Immediate Anesthesia Transfer of Care Note  Patient: Brett Humphrey  Procedure(s) Performed: ANTERIOR CERVICAL DECOMPRESSION/DISCECTOMY FUSION C3-7 (N/A Spine Cervical)  Patient Location: ICU  Anesthesia Type:General  Level of Consciousness: sedated and Patient remains intubated per anesthesia plan  Airway & Oxygen Therapy: Patient remains intubated per anesthesia plan and Patient placed on Ventilator (see vital sign flow sheet for setting)  Post-op Assessment: Report given to RN and Post -op Vital signs reviewed and stable  Post vital signs: Reviewed  Last Vitals:  Vitals Value Taken Time  BP 128/81 06/06/2018  5:02 PM  Temp    Pulse 83 06/06/2018  5:05 PM  Resp 16 06/06/2018  5:05 PM  SpO2 99 % 06/06/2018  5:05 PM  Vitals shown include unvalidated device data.  Last Pain:  Vitals:   06/06/18 0808  TempSrc:   PainSc: 3          Complications: No apparent anesthesia complications

## 2018-06-06 NOTE — Op Note (Signed)
Operative report  Preoperative diagnosis: Cervical spondylitic myelopathy with multilevel degenerative disc disease C3-7  Postoperative diagnosis: Same  Operative procedure: Anterior cervical discectomy and fusion C3-7  First assistant: Anette Riedel, PA  Complications: None  Intraoperative neuro monitoring: SSEP, evoked motor potential, running EMGs were all monitored throughout the case.  No change from baseline, or deterioration was detected.  Implants:  C3-4: 0 profile 7 mm medium lordotic intervertebral cage.  2 integrated 14 mm locking screws.   C4-5: Titan nano lock intervertebral cage.  6 mm medium lordotic.   C5-6: Titan nano lock intervertebral cage 7 mm medium lordotic   C6-7: 0 profile 6 mm medium lordotic intervertebral cage.  2 integrated 14 mm locking screws  Allograft:  vivogen  Indications: This is a very pleasant 57 year old gentleman who is having progressive debilitating neck pain intermittent radicular arm pain occasional difficulty maintaining his balance.  Imaging studies and clinical exam demonstrated cervical spondylitic disease with myelopathy.  As a result of the failure of conservative management and progressive debilitation and his quality of life we elected to proceed with surgery.  Patient would undergo a planned two-stage procedure.  Today the patient underwent the anterior cervical discectomy and fusions C3-7.  Plan will be to return to the OR tomorrow for posterior supplemental fixation and arthrodesis.  All appropriate risks benefits and alternatives to surgery were discussed with the patient and his wife and consent was obtained.  Operative report  Patient was brought the operating room placed upon the operating table.  After successful induction of general anesthesia and endotracheal intubation teds SCDs and a Foley were inserted and the neuro monitoring representative placed all appropriate paddles and needles for intraoperative monitoring.  The anterior  cervical spine was then prepped and draped in standard fashion.  Timeout was taken to confirm patient procedure and all other important data.  Fluoroscopy was then used to mark the C3 and C7 vertebral bodies and a left longitudinal incision was marked and infiltrated with quarter percent Marcaine.  I performed a standard Smith-Robinson approach to the anterior cervical spine via longitudinal incision.  A longitudinal incision was made and sharp dissection was carried out down to the platysma.  The platysma was sharply incised and I sharply dissected along the medial border of the sternocleidomastoid.  Identified and sacrificed the omohyoid muscle and then identified the external jugular and protected it.  I continued my dissection and superiorly and inferiorly sharply dissecting through the deep cervical and prevertebral fascia I could now palpate and visualize the anterior cervical spine thyroid retractor was placed in the esophagus and trachea was swept to the right and I used Kitner dissectors to remove the remaining prevertebral fascia to expose the anterior cervical spine from C3-C7.  Needle were placed in the C3-4 and C6-7 disc space to confirm the appropriate levels.  I mobilized the longus coli muscles using bipolar electrocautery from the mid body of C3 to the inferior aspect of C7.  I then placed the retracting blades underneath the longus coli muscle deflated the endotracheal cuff and expanded the retractors so I could visualize the C3-4 disc space.  Distraction pins were placed into the body of C3 and C4 and annulotomy was performed with a 15 blade scalpel.  I trimmed down the overhanging osteophyte with a 2 mm Kerrison Roger and then used pituitary Aundria Rud to remove the bulk of the disc material I then used curettes to continue my discectomy down to the posterior annulus.  I distracted the intervertebral  space and then maintain that with the distraction pin system.  I then used my fine nerve hook to  dissect through the posterior annulus and posterior longitudinal ligament.  I then used by 1 mm Kerrison Roger to resect the PLL and posterior annulus to adequately decompress this level.  Again went under the uncovertebral joints bilaterally so I could also an indirect foraminotomy.  At this point I had an excellent discectomy and decompression.  Using trial spacers I elected to use the size 7 0 profile locking system.  Because I would be doing an anterior posterior fusion I did not feel as though every level needed to be done with the integrated locking screw cage system.  I did want to make sure the cephalad and caudal margins were well secured as this was the area of highest likelihood of pseudoarthrosis.  Therefore at this slipped C3-4 level I used the 0 profile Titan cage packed with the allograft.  Once it was properly seated I then placed the 14 mm locking screws through the cage and into the vertebral body of C3 and C4.  This provided additional fixation.  With the C3-4 space decompressed and fused I then repositioned my retracting system to expose the C6-7 level.  Using the exact same technique I used at C3-4 I performed a complete discectomy.  There was significant large osteophytes posteriorly at this level which using a nerve hook and 1 mm Kerrison Roger I was able to resect.  I also used my micro-curettes.  Once I had an adequate discectomy at this level I then used the trial spacers and elected to use the 0 profile 6 mm cage.  This provided excellent fixation and maintain the distraction and increased neural foraminal volume  Cage was packed with the allograft and then malleted to the appropriate depth again to 14 mm locking screws were then advanced through the cage and into the vertebral body of 6 and 7.  Cephalad and caudal fixation points secured I then located the distraction system to expose the 4 5 and 5 6 levels.  An annulotomy was performed again using a 15 blade scalpel and using the same  technique I performed discectomies at 4 5 and 5 6.  There was a slight retrolisthesis noted at C4-5 shows able to gently reduce using the distraction pin system.  I again took down the posterior longitudinal ligament in order to adequately decompress the thecal sac at these 2 levels.  The C5-6 level was the area that I sure an adequate discectomy as this was where the cord signal changes were noted.  With the posterior longitudinal resected and the hard disc osteophytes removed I irrigated the wound copiously normal saline I can rasped the endplates and then elected to use the straightforward intervertebral cage at these levels.  Because I would be stabilized the patient posteriorly I did not feel as though the additional fixation was needed at the 4 5 and 5 6 level.  I also did not want a further compromise the vertebral body itself by placing 2 screws at each of those vertebral bodies.  The cages were packed with the allograft and malleted to the appropriate depth.  Both cages had excellent fixation.  They were well seated.  At this point with all 4 discs removed and intervertebral cages properly seated I irrigated the wound copiously with normal saline.  I made sure that hemostasis using bipolar electrocautery and FloSeal.  I then returned the trachea and esophagus to midline  and closed the platysma with interrupted 2-0 Vicryl suture in the skin with a 3-0 Monocryl.  Steri-Strips and a dry dressing were applied.  The Aspen collar was secured and the patient was transferred to the ICU intubated in stable.  Because of the upcoming surgery tomorrow the decision was made to leave him intubated.  This would facilitate airway protection and provide adequate means for pain control this evening.  Final EMG and evoked motor potential and SSEP monitors were done and there was no deviation or deterioration in signal noted.  At the end of the case all needle and sponge counts were correct.  The patient was ultimately  transferred to the ICU as indicated.

## 2018-06-07 ENCOUNTER — Inpatient Hospital Stay (HOSPITAL_COMMUNITY): Payer: Commercial Managed Care - PPO

## 2018-06-07 ENCOUNTER — Encounter (HOSPITAL_COMMUNITY)
Admission: RE | Disposition: A | Discharge status: Home / Self Care | Payer: Self-pay | Source: Home / Self Care | Attending: Orthopedic Surgery

## 2018-06-07 ENCOUNTER — Inpatient Hospital Stay (HOSPITAL_COMMUNITY)
Admission: RE | Admit: 2018-06-07 | Payer: Commercial Managed Care - PPO | Source: Ambulatory Visit | Admitting: Orthopedic Surgery

## 2018-06-07 ENCOUNTER — Encounter (HOSPITAL_COMMUNITY): Payer: Self-pay | Admitting: Orthopedic Surgery

## 2018-06-07 DIAGNOSIS — Z419 Encounter for procedure for purposes other than remedying health state, unspecified: Secondary | ICD-10-CM

## 2018-06-07 DIAGNOSIS — Z981 Arthrodesis status: Secondary | ICD-10-CM

## 2018-06-07 HISTORY — PX: POSTERIOR CERVICAL FUSION/FORAMINOTOMY: SHX5038

## 2018-06-07 LAB — CBC WITH DIFFERENTIAL/PLATELET
ABS IMMATURE GRANULOCYTES: 0 10*3/uL (ref 0.0–0.1)
BASOS ABS: 0 10*3/uL (ref 0.0–0.1)
Basophils Relative: 0 %
EOS PCT: 0 %
Eosinophils Absolute: 0 10*3/uL (ref 0.0–0.7)
HEMATOCRIT: 43.5 % (ref 39.0–52.0)
HEMOGLOBIN: 13.9 g/dL (ref 13.0–17.0)
Immature Granulocytes: 1 %
LYMPHS ABS: 0.9 10*3/uL (ref 0.7–4.0)
LYMPHS PCT: 11 %
MCH: 29.8 pg (ref 26.0–34.0)
MCHC: 32 g/dL (ref 30.0–36.0)
MCV: 93.3 fL (ref 78.0–100.0)
MONO ABS: 0.6 10*3/uL (ref 0.1–1.0)
MONOS PCT: 7 %
NEUTROS ABS: 6.4 10*3/uL (ref 1.7–7.7)
Neutrophils Relative %: 81 %
Platelets: 235 10*3/uL (ref 150–400)
RBC: 4.66 MIL/uL (ref 4.22–5.81)
RDW: 13.6 % (ref 11.5–15.5)
WBC: 7.9 10*3/uL (ref 4.0–10.5)

## 2018-06-07 LAB — COMPREHENSIVE METABOLIC PANEL
ALK PHOS: 46 U/L (ref 38–126)
ALT: 16 U/L (ref 0–44)
ANION GAP: 12 (ref 5–15)
AST: 23 U/L (ref 15–41)
Albumin: 3.4 g/dL — ABNORMAL LOW (ref 3.5–5.0)
BILIRUBIN TOTAL: 1.1 mg/dL (ref 0.3–1.2)
BUN: 12 mg/dL (ref 6–20)
CALCIUM: 8.7 mg/dL — AB (ref 8.9–10.3)
CO2: 25 mmol/L (ref 22–32)
CREATININE: 1.07 mg/dL (ref 0.61–1.24)
Chloride: 103 mmol/L (ref 98–111)
GFR calc Af Amer: >60 mL/min (ref >60)
GFR calc non Af Amer: >60 mL/min (ref >60)
Glucose, Bld: 133 mg/dL — ABNORMAL HIGH (ref 70–99)
Potassium: 4 mmol/L (ref 3.5–5.1)
SODIUM: 140 mmol/L (ref 135–145)
TOTAL PROTEIN: 5.9 g/dL — AB (ref 6.5–8.1)

## 2018-06-07 LAB — TRIGLYCERIDES: TRIGLYCERIDES: 178 mg/dL — AB (ref <150)

## 2018-06-07 LAB — MAGNESIUM: Magnesium: 2.1 mg/dL (ref 1.7–2.4)

## 2018-06-07 SURGERY — POSTERIOR CERVICAL FUSION/FORAMINOTOMY LEVEL 4
Anesthesia: General

## 2018-06-07 MED ORDER — LORAZEPAM 0.5 MG PO TABS
0.5000 mg | ORAL_TABLET | Freq: Four times a day (QID) | ORAL | Status: DC | PRN
  Administered 2018-06-07 – 2018-06-09 (×4): 1 mg via ORAL
  Filled 2018-06-07 (×4): qty 2

## 2018-06-07 MED ORDER — LACTATED RINGERS IV SOLN
INTRAVENOUS | Status: DC | PRN
  Administered 2018-06-07: 08:00:00 via INTRAVENOUS

## 2018-06-07 MED ORDER — EPHEDRINE 5 MG/ML INJ
INTRAVENOUS | Status: AC
  Filled 2018-06-07: qty 20

## 2018-06-07 MED ORDER — OXYCODONE HCL 5 MG PO TABS: 5.0000 mg | ORAL_TABLET | Freq: Once | ORAL | Status: DC | PRN

## 2018-06-07 MED ORDER — DIPHENHYDRAMINE HCL 50 MG/ML IJ SOLN
INTRAMUSCULAR | Status: AC
  Filled 2018-06-07: qty 1

## 2018-06-07 MED ORDER — ROCURONIUM BROMIDE 50 MG/5ML IV SOSY
PREFILLED_SYRINGE | INTRAVENOUS | Status: AC
  Filled 2018-06-07: qty 10

## 2018-06-07 MED ORDER — HEMOSTATIC AGENTS (NO CHARGE) OPTIME
TOPICAL | Status: DC | PRN
  Administered 2018-06-07 (×2): 1 via TOPICAL

## 2018-06-07 MED ORDER — SODIUM CHLORIDE 0.9 % IV SOLN: 250.0000 mL | INTRAVENOUS | Status: DC

## 2018-06-07 MED ORDER — HYDROMORPHONE HCL 1 MG/ML IJ SOLN
1.0000 mg | INTRAMUSCULAR | Status: DC | PRN
  Administered 2018-06-07 – 2018-06-08 (×7): 1 mg via INTRAVENOUS
  Filled 2018-06-07 (×7): qty 1

## 2018-06-07 MED ORDER — HYDROMORPHONE HCL 1 MG/ML IJ SOLN
0.2500 mg | INTRAMUSCULAR | Status: DC | PRN
  Administered 2018-06-07 (×2): 0.5 mg via INTRAVENOUS

## 2018-06-07 MED ORDER — MAGNESIUM CITRATE PO SOLN: 1.0000 | Freq: Once | ORAL | Status: DC | PRN

## 2018-06-07 MED ORDER — ONDANSETRON HCL 4 MG/2ML IJ SOLN
INTRAMUSCULAR | Status: AC
  Filled 2018-06-07: qty 2

## 2018-06-07 MED ORDER — TRANEXAMIC ACID 1000 MG/10ML IV SOLN
INTRAVENOUS | Status: DC | PRN
  Administered 2018-06-07: 1000 mg via INTRAVENOUS

## 2018-06-07 MED ORDER — CEFAZOLIN SODIUM-DEXTROSE 2-3 GM-%(50ML) IV SOLR
INTRAVENOUS | Status: DC | PRN
  Administered 2018-06-07 (×2): 2 g via INTRAVENOUS

## 2018-06-07 MED ORDER — THROMBIN 20000 UNITS EX SOLR
CUTANEOUS | Status: DC | PRN
  Administered 2018-06-07 (×2): 20000 [IU] via TOPICAL

## 2018-06-07 MED ORDER — HYDROMORPHONE HCL 2 MG PO TABS
2.0000 mg | ORAL_TABLET | ORAL | Status: DC | PRN
  Administered 2018-06-07 – 2018-06-08 (×6): 4 mg via ORAL
  Administered 2018-06-08: 2 mg via ORAL
  Administered 2018-06-09 – 2018-06-10 (×9): 4 mg via ORAL
  Filled 2018-06-07 (×17): qty 2

## 2018-06-07 MED ORDER — 0.9 % SODIUM CHLORIDE (POUR BTL) OPTIME
TOPICAL | Status: DC | PRN
  Administered 2018-06-07 (×3): 1000 mL

## 2018-06-07 MED ORDER — PROPOFOL 500 MG/50ML IV EMUL
INTRAVENOUS | Status: DC | PRN
  Administered 2018-06-07: 50 ug/kg/min via INTRAVENOUS

## 2018-06-07 MED ORDER — ONDANSETRON 4 MG PO TBDP: 4.0000 mg | ORAL_TABLET | Freq: Three times a day (TID) | ORAL | 0 refills | Status: DC | PRN

## 2018-06-07 MED ORDER — ACETAMINOPHEN 325 MG PO TABS: 650.0000 mg | ORAL_TABLET | ORAL | Status: DC | PRN

## 2018-06-07 MED ORDER — FENTANYL CITRATE (PF) 250 MCG/5ML IJ SOLN
INTRAMUSCULAR | Status: AC
  Filled 2018-06-07: qty 5

## 2018-06-07 MED ORDER — MENTHOL 3 MG MT LOZG: 1.0000 | LOZENGE | OROMUCOSAL | Status: DC | PRN

## 2018-06-07 MED ORDER — MIDAZOLAM HCL 2 MG/2ML IJ SOLN
INTRAMUSCULAR | Status: AC
  Filled 2018-06-07: qty 2

## 2018-06-07 MED ORDER — MORPHINE SULFATE (PF) 2 MG/ML IV SOLN
2.0000 mg | INTRAVENOUS | Status: DC | PRN
  Administered 2018-06-07 (×2): 2 mg via INTRAVENOUS
  Filled 2018-06-07 (×2): qty 1

## 2018-06-07 MED ORDER — DIPHENHYDRAMINE HCL 50 MG/ML IJ SOLN
INTRAMUSCULAR | Status: DC | PRN
  Administered 2018-06-07: 12.5 mg via INTRAVENOUS

## 2018-06-07 MED ORDER — OXYCODONE HCL 5 MG PO TABS: 5.0000 mg | ORAL_TABLET | ORAL | Status: DC | PRN

## 2018-06-07 MED ORDER — ONDANSETRON HCL 4 MG PO TABS: 4.0000 mg | ORAL_TABLET | Freq: Four times a day (QID) | ORAL | Status: DC | PRN

## 2018-06-07 MED ORDER — SODIUM CHLORIDE 0.9% FLUSH
3.0000 mL | Freq: Two times a day (BID) | INTRAVENOUS | Status: DC
  Administered 2018-06-07 – 2018-06-10 (×3): 3 mL via INTRAVENOUS

## 2018-06-07 MED ORDER — METHOCARBAMOL 1000 MG/10ML IJ SOLN
500.0000 mg | Freq: Four times a day (QID) | INTRAVENOUS | Status: DC | PRN
  Filled 2018-06-07: qty 5

## 2018-06-07 MED ORDER — DOCUSATE SODIUM 100 MG PO CAPS
100.0000 mg | ORAL_CAPSULE | Freq: Two times a day (BID) | ORAL | Status: DC
  Administered 2018-06-07 – 2018-06-10 (×7): 100 mg via ORAL
  Filled 2018-06-07 (×7): qty 1

## 2018-06-07 MED ORDER — PROPOFOL 10 MG/ML IV BOLUS
INTRAVENOUS | Status: AC
  Filled 2018-06-07: qty 20

## 2018-06-07 MED ORDER — THROMBIN 20000 UNITS EX KIT
PACK | CUTANEOUS | Status: AC
  Filled 2018-06-07: qty 1

## 2018-06-07 MED ORDER — GLYCOPYRROLATE PF 0.2 MG/ML IJ SOSY
PREFILLED_SYRINGE | INTRAMUSCULAR | Status: AC
  Filled 2018-06-07: qty 1

## 2018-06-07 MED ORDER — OXYCODONE HCL 5 MG PO TABS
10.0000 mg | ORAL_TABLET | ORAL | Status: DC | PRN
  Administered 2018-06-07 (×2): 10 mg via ORAL
  Filled 2018-06-07 (×2): qty 2

## 2018-06-07 MED ORDER — LIDOCAINE 2% (20 MG/ML) 5 ML SYRINGE
INTRAMUSCULAR | Status: AC
  Filled 2018-06-07: qty 5

## 2018-06-07 MED ORDER — DEXMEDETOMIDINE HCL 200 MCG/2ML IV SOLN
INTRAVENOUS | Status: DC | PRN
  Administered 2018-06-07 (×5): 4 ug via INTRAVENOUS

## 2018-06-07 MED ORDER — POLYETHYLENE GLYCOL 3350 17 G PO PACK: 17.0000 g | PACK | Freq: Every day | ORAL | Status: DC | PRN

## 2018-06-07 MED ORDER — TRANEXAMIC ACID 1000 MG/10ML IV SOLN
1000.0000 mg | INTRAVENOUS | Status: AC
  Filled 2018-06-07: qty 10

## 2018-06-07 MED ORDER — SODIUM CHLORIDE 0.9% FLUSH: 3.0000 mL | INTRAVENOUS | Status: DC | PRN

## 2018-06-07 MED ORDER — ONDANSETRON HCL 4 MG/2ML IJ SOLN
4.0000 mg | Freq: Four times a day (QID) | INTRAMUSCULAR | Status: DC | PRN
  Administered 2018-06-08: 4 mg via INTRAVENOUS
  Filled 2018-06-07: qty 2

## 2018-06-07 MED ORDER — METHOCARBAMOL 500 MG PO TABS
500.0000 mg | ORAL_TABLET | Freq: Four times a day (QID) | ORAL | Status: DC | PRN
  Administered 2018-06-07 – 2018-06-08 (×4): 500 mg via ORAL
  Filled 2018-06-07 (×4): qty 1

## 2018-06-07 MED ORDER — ONDANSETRON HCL 4 MG/2ML IJ SOLN
INTRAMUSCULAR | Status: DC | PRN
  Administered 2018-06-07: 4 mg via INTRAVENOUS

## 2018-06-07 MED ORDER — FENTANYL CITRATE (PF) 100 MCG/2ML IJ SOLN
25.0000 ug | INTRAMUSCULAR | Status: DC | PRN
  Administered 2018-06-07 (×4): 25 ug via INTRAVENOUS

## 2018-06-07 MED ORDER — BUPIVACAINE-EPINEPHRINE (PF) 0.25% -1:200000 IJ SOLN
INTRAMUSCULAR | Status: AC
  Filled 2018-06-07: qty 30

## 2018-06-07 MED ORDER — OXYCODONE-ACETAMINOPHEN 10-325 MG PO TABS: 1.0000 | ORAL_TABLET | ORAL | 0 refills | Status: AC | PRN

## 2018-06-07 MED ORDER — LACTATED RINGERS IV SOLN
INTRAVENOUS | Status: DC
  Administered 2018-06-07: 17:00:00 via INTRAVENOUS

## 2018-06-07 MED ORDER — BUPIVACAINE-EPINEPHRINE 0.25% -1:200000 IJ SOLN
INTRAMUSCULAR | Status: DC | PRN
  Administered 2018-06-07: 20 mL

## 2018-06-07 MED ORDER — MIDAZOLAM HCL 5 MG/5ML IJ SOLN
INTRAMUSCULAR | Status: DC | PRN
  Administered 2018-06-07: 2 mg via INTRAVENOUS

## 2018-06-07 MED ORDER — PHENYLEPHRINE HCL 10 MG/ML IJ SOLN
INTRAMUSCULAR | Status: AC
  Filled 2018-06-07: qty 1

## 2018-06-07 MED ORDER — OXYCODONE HCL 5 MG/5ML PO SOLN: 5.0000 mg | Freq: Once | ORAL | Status: DC | PRN

## 2018-06-07 MED ORDER — SODIUM CHLORIDE 0.9 % IJ SOLN
INTRAMUSCULAR | Status: AC
  Filled 2018-06-07: qty 10

## 2018-06-07 MED ORDER — LUBIPROSTONE 24 MCG PO CAPS: 24.0000 ug | ORAL_CAPSULE | Freq: Two times a day (BID) | ORAL | 0 refills | Status: DC

## 2018-06-07 MED ORDER — METHOCARBAMOL 500 MG PO TABS: 500.0000 mg | ORAL_TABLET | Freq: Three times a day (TID) | ORAL | 0 refills | Status: DC

## 2018-06-07 MED ORDER — ACETAMINOPHEN 650 MG RE SUPP: 650.0000 mg | RECTAL | Status: DC | PRN

## 2018-06-07 MED ORDER — DEXAMETHASONE SODIUM PHOSPHATE 10 MG/ML IJ SOLN
INTRAMUSCULAR | Status: DC | PRN
  Administered 2018-06-07: 5 mg via INTRAVENOUS

## 2018-06-07 MED ORDER — PHENOL 1.4 % MT LIQD: 1.0000 | OROMUCOSAL | Status: DC | PRN

## 2018-06-07 MED ORDER — ROCURONIUM BROMIDE 50 MG/5ML IV SOSY
PREFILLED_SYRINGE | INTRAVENOUS | Status: AC
  Filled 2018-06-07: qty 5

## 2018-06-07 MED ORDER — DEXAMETHASONE SODIUM PHOSPHATE 10 MG/ML IJ SOLN
INTRAMUSCULAR | Status: AC
  Filled 2018-06-07: qty 1

## 2018-06-07 MED ORDER — MEPERIDINE HCL 50 MG/ML IJ SOLN: 6.2500 mg | INTRAMUSCULAR | Status: DC | PRN

## 2018-06-07 MED ORDER — PROMETHAZINE HCL 25 MG/ML IJ SOLN: 6.2500 mg | INTRAMUSCULAR | Status: DC | PRN

## 2018-06-07 MED ORDER — SUGAMMADEX SODIUM 200 MG/2ML IV SOLN
INTRAVENOUS | Status: DC | PRN
  Administered 2018-06-07: 200 mg via INTRAVENOUS

## 2018-06-07 MED ORDER — SODIUM CHLORIDE 0.9 % IV SOLN
0.0500 ug/kg/min | INTRAVENOUS | Status: DC
  Administered 2018-06-07: .1 ug/kg/min via INTRAVENOUS
  Filled 2018-06-07: qty 5000

## 2018-06-07 MED ORDER — ROCURONIUM BROMIDE 50 MG/5ML IV SOSY
PREFILLED_SYRINGE | INTRAVENOUS | Status: DC | PRN
  Administered 2018-06-07 (×2): 50 mg via INTRAVENOUS

## 2018-06-07 MED FILL — Thrombin For Soln Kit 20000 Unit: CUTANEOUS | Qty: 1 | Status: AC

## 2018-06-07 SURGICAL SUPPLY — 89 items
ADH SKN CLS APL DERMABOND .7 (GAUZE/BANDAGES/DRESSINGS) ×1
AGENT HMST KT MTR STRL THRMB (HEMOSTASIS) ×2
BIT DRILL MOUNTAINEER 16MM (BIT) ×1 IMPLANT
BIT DRILL MOUNTAINEER FIX 14 (BIT) ×1
BIT DRILL MOUNTAINEER FIX 14MM (BIT) ×1 IMPLANT
BLADE CLIPPER SURG (BLADE) ×2 IMPLANT
BONE VIVIGEN FORMABLE 5.4CC (Bone Implant) ×2 IMPLANT
BUR MATCHSTICK NEURO 3.0 LAGG (BURR) ×2 IMPLANT
CLSR STERI-STRIP ANTIMIC 1/2X4 (GAUZE/BANDAGES/DRESSINGS) ×2 IMPLANT
CORDS BIPOLAR (ELECTRODE) ×2 IMPLANT
COVER MAYO STAND STRL (DRAPES) ×4 IMPLANT
COVER SURGICAL LIGHT HANDLE (MISCELLANEOUS) ×2 IMPLANT
DERMABOND ADVANCED (GAUZE/BANDAGES/DRESSINGS) ×1
DERMABOND ADVANCED .7 DNX12 (GAUZE/BANDAGES/DRESSINGS) ×1 IMPLANT
DRAPE C-ARM 42X72 X-RAY (DRAPES) ×2 IMPLANT
DRAPE POUCH INSTRU U-SHP 10X18 (DRAPES) ×2 IMPLANT
DRAPE SURG 17X23 STRL (DRAPES) ×6 IMPLANT
DRAPE U-SHAPE 47X51 STRL (DRAPES) ×2 IMPLANT
DRILL BIT MOUNTAINEER 16MM (BIT) ×2
DRILL BIT MOUNTAINEER FIX 14MM (BIT) ×2
DRSG ADAPTIC 3X8 NADH LF (GAUZE/BANDAGES/DRESSINGS) IMPLANT
DRSG MEPILEX BORDER 4X8 (GAUZE/BANDAGES/DRESSINGS) IMPLANT
DRSG OPSITE POSTOP 4X10 (GAUZE/BANDAGES/DRESSINGS) ×2 IMPLANT
DURAPREP 26ML APPLICATOR (WOUND CARE) ×2 IMPLANT
ELECT BLADE 4.0 EZ CLEAN MEGAD (MISCELLANEOUS) ×2
ELECT BLADE 6.5 EXT (BLADE) IMPLANT
ELECT CAUTERY BLADE 6.4 (BLADE) ×2 IMPLANT
ELECT PENCIL ROCKER SW 15FT (MISCELLANEOUS) ×2 IMPLANT
ELECT REM PT RETURN 9FT ADLT (ELECTROSURGICAL) ×2
ELECTRODE BLDE 4.0 EZ CLN MEGD (MISCELLANEOUS) ×1 IMPLANT
ELECTRODE REM PT RTRN 9FT ADLT (ELECTROSURGICAL) ×1 IMPLANT
EVACUATOR 1/8 PVC DRAIN (DRAIN) IMPLANT
GLOVE BIO SURGEON STRL SZ 6.5 (GLOVE) ×2 IMPLANT
GLOVE BIOGEL PI IND STRL 6.5 (GLOVE) ×1 IMPLANT
GLOVE BIOGEL PI IND STRL 8.5 (GLOVE) ×1 IMPLANT
GLOVE BIOGEL PI INDICATOR 6.5 (GLOVE) ×1
GLOVE BIOGEL PI INDICATOR 8.5 (GLOVE) ×1
GLOVE SS BIOGEL STRL SZ 8.5 (GLOVE) ×1 IMPLANT
GLOVE SUPERSENSE BIOGEL SZ 8.5 (GLOVE) ×1
GOWN STRL REUS W/ TWL LRG LVL3 (GOWN DISPOSABLE) ×1 IMPLANT
GOWN STRL REUS W/TWL 2XL LVL3 (GOWN DISPOSABLE) ×4 IMPLANT
GOWN STRL REUS W/TWL LRG LVL3 (GOWN DISPOSABLE) ×2
IV CATH 14GX2 1/4 (CATHETERS) ×2 IMPLANT
KIT BASIN OR (CUSTOM PROCEDURE TRAY) ×2 IMPLANT
KIT POSITION SURG JACKSON T1 (MISCELLANEOUS) ×2 IMPLANT
KIT TURNOVER KIT B (KITS) ×2 IMPLANT
MIX DBX 10CC 35% BONE (Bone Implant) ×2 IMPLANT
NEEDLE 22X1 1/2 (OR ONLY) (NEEDLE) ×2 IMPLANT
NS IRRIG 1000ML POUR BTL (IV SOLUTION) ×2 IMPLANT
NUT HH X CONN OUTER (Orthopedic Implant) ×4 IMPLANT
PACK LAMINECTOMY ORTHO (CUSTOM PROCEDURE TRAY) ×2 IMPLANT
PACK UNIVERSAL I (CUSTOM PROCEDURE TRAY) ×2 IMPLANT
PAD ARMBOARD 7.5X6 YLW CONV (MISCELLANEOUS) ×4 IMPLANT
PATTIES SURGICAL .5 X.5 (GAUZE/BANDAGES/DRESSINGS) IMPLANT
PATTIES SURGICAL .5 X1 (DISPOSABLE) ×2 IMPLANT
PLATE HH X CONN 28MM (Plate) ×2 IMPLANT
ROD MOUTAINEER 3.5X200MM (Rod) ×2 IMPLANT
ROD TEMPLATE MOUNTAINEER 120MM (ROD) ×2 IMPLANT
SCREW F A 3.5X12 (Screw) ×2 IMPLANT
SCREW F A 3.5X14 (Screw) ×12 IMPLANT
SCREW HH X CONN INNER (Screw) ×4 IMPLANT
SCREW INNER (Screw) ×14 IMPLANT
SCREW M L 3.5X14 (Screw) ×2 IMPLANT
SCREW POLY MOUNTAINEER 3.5X10 (Screw) ×2 IMPLANT
SPONGE LAP 4X18 RFD (DISPOSABLE) ×2 IMPLANT
SPONGE SURGIFOAM ABS GEL 100 (HEMOSTASIS) ×2 IMPLANT
STRIP CLOSURE SKIN 1/2X4 (GAUZE/BANDAGES/DRESSINGS) ×2 IMPLANT
SURGIFLO W/THROMBIN 8M KIT (HEMOSTASIS) ×6 IMPLANT
SURGILUBE 2OZ TUBE FLIPTOP (MISCELLANEOUS) ×2 IMPLANT
SUT BONE WAX W31G (SUTURE) ×2 IMPLANT
SUT ETHILON 3 0 FSL (SUTURE) IMPLANT
SUT PDS AB 1 CTX 36 (SUTURE) ×2 IMPLANT
SUT PROLENE 0 CT (SUTURE) IMPLANT
SUT PROLENE 2 0 CT2 30 (SUTURE) IMPLANT
SUT VIC AB 0 CTB1 27 (SUTURE) ×4 IMPLANT
SUT VIC AB 1 CT1 18XCR BRD 8 (SUTURE) ×1 IMPLANT
SUT VIC AB 1 CT1 8-18 (SUTURE) ×2
SUT VIC AB 1 CTX 18 (SUTURE) ×2 IMPLANT
SUT VIC AB 1 CTX 36 (SUTURE) ×4
SUT VIC AB 1 CTX36XBRD ANBCTR (SUTURE) ×2 IMPLANT
SUT VIC AB 2-0 CT1 18 (SUTURE) ×2 IMPLANT
SYR BULB IRRIGATION 50ML (SYRINGE) ×2 IMPLANT
SYR CONTROL 10ML LL (SYRINGE) ×2 IMPLANT
TAP MOUNTAINEER 3MM (TAP) ×2 IMPLANT
TOWEL OR 17X24 6PK STRL BLUE (TOWEL DISPOSABLE) ×2 IMPLANT
TOWEL OR 17X26 10 PK STRL BLUE (TOWEL DISPOSABLE) ×2 IMPLANT
TRAY FOLEY MTR SLVR 16FR STAT (SET/KITS/TRAYS/PACK) IMPLANT
WATER STERILE IRR 1000ML POUR (IV SOLUTION) IMPLANT
YANKAUER SUCT BULB TIP NO VENT (SUCTIONS) ×2 IMPLANT

## 2018-06-07 NOTE — Discharge Instructions (Signed)
Cervical Fusion, Care After °This sheet gives you information about how to care for yourself after your procedure. Your health care provider may also give you more specific instructions. If you have problems or questions, contact your health care provider. °What can I expect after the procedure? °After the procedure, it is common to have: °· Incision area pain. °· Numbness. °· Weakness. °· Sore throat. °· Difficulty swallowing. ° °Follow these instructions at home: °Medicines °· Take over-the-counter and prescription medicines, including pain medicines, only as told by your health care provider. °· If you were prescribed an antibiotic medicine, take it as told by your health care provider. Do not stop taking the antibiotic even if you start to feel better. °If you have a brace: °· Wear the brace as told by your health care provider. Remove it only as told by your health care provider. °· Keep the brace clean. °Incision care °· Follow instructions from your health care provider about how to take care of your incision. Make sure you: °? Wash your hands with soap and water before you change your bandage (dressing). If soap and water are not available, use hand sanitizer. °? Change your dressing as told by your health care provider. °? Leave stitches (sutures), skin glue, or adhesive strips in place. These skin closures may need to be in place for 2 weeks or longer. If adhesive strip edges start to loosen and curl up, you may trim the loose edges. Do not remove adhesive strips completely unless your health care provider tells you to do that. °· Keep your incision clean and dry. Do not take baths, swim, or use a hot tub until your health care provider approves. °· Check your incision area every day for signs of infection. Check for: °? More redness, swelling, or pain. °? More fluid or blood. °? Warmth. °? Pus or a bad smell. °Activity ° °· Rest and protect your back as much as possible. °· Do not lift anything that is  heavier than 10 lb (4.5 kg) or the limit that you are told by your health care provider. °· Do not twist or bend at the waist until your health care provider approves. °· Avoid: °? Pushing and pulling motions. °? Lifting anything over your head. °? Sitting or lying down in the same position for long periods of time. °· Do not exercise until your health care provider approves. Once your health care provider has approved exercise, ask him or her what kinds of exercises you can do to make your back stronger (physical therapy). °· Do not drive until your health care provider approves. °? Do not drive for 24 hours if you received a medicine to help you relax (sedative). °? Do not drive or use heavy machinery while taking prescription pain medicine. °General instructions ° °· Have someone assist you to turn in bed frequently by moving your whole body without twisting your back (log rolling technique). °· Wear compression stockings as told by your health care provider. These stockings help to prevent blood clots and reduce swelling in your legs. °· Do not use any products that contain nicotine or tobacco, such as cigarettes and e-cigarettes. These can delay bone healing. If you need help quitting, ask your health care provider. °· To prevent or treat constipation while you are taking prescription pain medicine, your health care provider may recommend that you: °? Drink enough fluid to keep your urine clear or pale yellow. °? Take over-the-counter or prescription medicines. °? Eat foods   that are high in fiber, such as fresh fruits and vegetables, whole grains, and beans. °? Limit foods that are high in fat and processed sugars, such as fried and sweet foods. °· Keep all follow-up visits as told by your health care provider. This is important. °Contact a health care provider if: °· You have pain that gets worse or does not get better with medicine. °· You have more redness, swelling, or pain around your incision. °· You have  more fluid or blood coming from your incision. °· Your incision feels warm to the touch. °· You have pus or a bad smell coming from your incision. °· You have a fever. °· You have weakness or numbness in your legs that is new or getting worse. °· You have swelling in your calf or leg. °· The edges of your incision break open. °· You vomit or feel nauseous. °· You have trouble controlling urination or bowel movements. °Get help right away if: °· You develop shortness of breath or chest pain. °· You have trouble swallowing. °· You have trouble breathing. °· You develop a cough. °This information is not intended to replace advice given to you by your health care provider. Make sure you discuss any questions you have with your health care provider. °Document Released: 05/03/2004 Document Revised: 04/13/2016 Document Reviewed: 03/30/2016 °[employer] Interactive Patient Education © 2018 [employer] ° ° ° ° ° ° °

## 2018-06-07 NOTE — Progress Notes (Signed)
Brett Humphrey  ESL:[id number] DOB: June 09, 1961 DOA: 06/06/2018 PCP: Wilfrid Lund, PA    LOS: 1 day   Reason for Consult / Chief Complaint:  Vent management  Consulting MD and date of consult:  Shon Baton 9/4  HPI/summary of hospital stay:  Patient is a 57 year old Caucasian male with past medical history of hyperlipidemia hypertension obstructive sleep apnea here for elective surgery for degenerative disc disease anterior approach C3-C7 fusion. Post operatively he was taken to the ICU on the ventilator in preparation for the posterior approach the following day. The second procedure was without complication and he was extubated postoperatively. Transferred back to ICU for recovery.   Subjective  Having postoperative pain  Assessment & Plan:   Inability to protect airway in Periop setting: Now extubated after stage 2 of surgery with no plans for further OR. - Supplemental O2 to keep sats > 90%. - Close monitoring and supplemental care.   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  - management per ortho - Pain management   Best Practice / Goals of Care / Disposition.   DVT prophylaxis: SCD GI prophylaxis: pepcid Diet: NPO - advance as tolerated Mobility: Bed rest Code Status: FULL Family Communication: Patient and family updated.   Disposition / Summary of Today's Plan 06/07/18   Will monitor in ICU overnight. Pain management and CPAP tonight if he can tolerate. He has a history of severe OSA   Consultants: date of consult/date signed off (if applicable)/final recs   CCM  Procedures:   Significant Diagnostic Tests:   Micro Data:   Antimicrobials:  Periop Ancef   Objective    Examination: General: middle aged male of normal body habitus in NAD HENT: C-collar in place. PERRL, no  appreciable JVD Lungs: Clear, unlabored Cardiovascular: RRR, no MRG Abdomen: Soft, non-tender, non-distended Extremities: No acute deformity  Neuro: alert, oriented  Blood pressure 138/77, pulse 92, temperature 97.7 F (36.5 C), temperature source Oral, resp. rate 13, height 5\' 10"  (1.778 m), weight 81.1 kg, SpO2 98 %.    Vent Mode: PRVC FiO2 (%):  [40 %] 40 % Set Rate:  [16 bmp] 16 bmp Vt Set:  [580 mL] 580 mL PEEP:  [5 cmH20] 5 cmH20 Plateau Pressure:  [12 cmH20-18 cmH20] 12 cmH20   Intake/Output Summary (Last 24 hours) at 06/07/2018 1800 Last data filed at 06/07/2018 1700 Gross per 24 hour  Intake 2863.54 ml  Output 3655 ml  Net -791.46 ml   Filed Weights   06/06/18 0808 06/06/18 1700  Weight: 79.9 kg 81.1 kg     Labs    CBC: Recent Labs  Lab 06/07/18 0500  WBC 7.9  NEUTROABS 6.4  HGB 13.9  HCT 43.5  MCV 93.3  PLT 235   Basic Metabolic Panel: Recent Labs  Lab 06/07/18 0500  NA 140  K 4.0  CL 103  CO2 25  GLUCOSE 133*  BUN 12  CREATININE 1.07  CALCIUM 8.7*  MG 2.1   GFR: Estimated Creatinine Clearance: 78.6 mL/min (by C-G formula based on SCr of 1.07 mg/dL). Recent Labs  Lab 06/07/18 0500  WBC 7.9   Liver Function Tests: Recent Labs  Lab 06/07/18 0500  AST 23  ALT 16  ALKPHOS 46  BILITOT 1.1  PROT 5.9*  ALBUMIN 3.4*   No results for input(s): LIPASE, AMYLASE in  the last 168 hours. No results for input(s): AMMONIA in the last 168 hours. ABG    Component Value Date/Time   PHART 7.374 06/06/2018 1737   PCO2ART 47.7 06/06/2018 1737   PO2ART 239.0 (H) 06/06/2018 1737   HCO3 27.9 06/06/2018 1737   TCO2 29 06/06/2018 1737   O2SAT 100.0 06/06/2018 1737    Coagulation Profile: No results for input(s): INR, PROTIME in the last 168 hours. Cardiac Enzymes: No results for input(s): CKTOTAL, CKMB, CKMBINDEX, TROPONINI in the last 168 hours. HbA1C: No results found for: HGBA1C CBG: No results for input(s): GLUCAP in the last 168  hours.  Brett Humphrey, AGACNP-BC [facility] Pulmonology/Critical Care Pager [phone] or [phone]  06/07/2018 6:33 PM

## 2018-06-07 NOTE — Brief Op Note (Signed)
06/07/2018  11:16 AM  PATIENT:  Brett Humphrey  57 y.o. male  PRE-OPERATIVE DIAGNOSIS:  Cervical spondylotic myelopathy  POST-OPERATIVE DIAGNOSIS:  Cervical spondylotic myelopathy  PROCEDURE:  Procedure(s) with comments: Posterior spinal fusion interbody C3-7 (N/A) - 4 hrs  SURGEON:  Surgeon(s) and Role:    Venita Lick, MD - Primary  PHYSICIAN ASSISTANT:   ASSISTANTS: Carmen Mayo   ANESTHESIA:   general  EBL:  300 mL   BLOOD ADMINISTERED:none  DRAINS: JP    LOCAL MEDICATIONS USED:  MARCAINE     SPECIMEN:  No Specimen  DISPOSITION OF SPECIMEN:  N/A  COUNTS:  YES  TOURNIQUET:  * No tourniquets in log *  DICTATION: .Dragon Dictation  PLAN OF CARE: Admit for overnight observation  PATIENT DISPOSITION:  PACU - hemodynamically stable.

## 2018-06-07 NOTE — Anesthesia Preprocedure Evaluation (Signed)
Anesthesia Evaluation  Patient identified by MRN, date of birth, ID band Patient awake    Reviewed: Allergy & Precautions, NPO status , Patient's Chart, lab work & pertinent test results  Airway Mallampati: II  TM Distance: >3 FB Neck ROM: Full    Dental  (+) Teeth Intact, Dental Advisory Given Patient has full set of braces on upper and lower teeth. No brackets or teeth loose per patient.:   Pulmonary sleep apnea and Continuous Positive Airway Pressure Ventilation , former smoker,    breath sounds clear to auscultation       Cardiovascular hypertension, Pt. on medications  Rhythm:Regular Rate:Normal     Neuro/Psych    GI/Hepatic Neg liver ROS, GERD  Medicated and Controlled,  Endo/Other  negative endocrine ROS  Renal/GU negative Renal ROS     Musculoskeletal  (+) Arthritis ,   Abdominal   Peds  Hematology   Anesthesia Other Findings   Reproductive/Obstetrics                             Anesthesia Physical  Anesthesia Plan  ASA: II  Anesthesia Plan: General   Post-op Pain Management:    Induction: Intravenous  PONV Risk Score and Plan: 2 and Ondansetron, Treatment may vary due to age or medical condition and Midazolam  Airway Management Planned: Oral ETT  Additional Equipment: Arterial line  Intra-op Plan:   Post-operative Plan: Extubation in OR  Informed Consent: I have reviewed the patients History and Physical, chart, labs and discussed the procedure including the risks, benefits and alternatives for the proposed anesthesia with the patient or authorized representative who has indicated his/her understanding and acceptance.   Dental advisory given  Plan Discussed with: CRNA, Anesthesiologist and Surgeon  Anesthesia Plan Comments: ( A line and 2 PIVs)        Anesthesia Quick Evaluation

## 2018-06-07 NOTE — Transfer of Care (Signed)
Immediate Anesthesia Transfer of Care Note  Patient: Brett Humphrey  Procedure(s) Performed: Posterior spinal fusion interbody C3-7 (N/A )  Patient Location: PACU  Anesthesia Type:General  Level of Consciousness: awake, alert , oriented and sedated  Airway & Oxygen Therapy: Patient Spontanous Breathing and Patient connected to face mask oxygen  Post-op Assessment: Report given to RN, Post -op Vital signs reviewed and stable and Patient moving all extremities  Post vital signs: Reviewed and stable  Last Vitals:  Vitals Value Taken Time  BP 140/98 06/07/2018 11:33 AM  Temp    Pulse 85 06/07/2018 11:39 AM  Resp 9 06/07/2018 11:39 AM  SpO2 100 % 06/07/2018 11:39 AM  Vitals shown include unvalidated device data.  Last Pain:  Vitals:   06/07/18 0400  TempSrc: Axillary  PainSc:          Complications: No apparent anesthesia complications

## 2018-06-07 NOTE — Op Note (Signed)
Operative report  Preoperative diagnosis: Cervical spondylitic myelopathy status post C3-7 ACDF  Postoperative diagnosis: Same  Operative procedure: Posterior spinal fusion and instrumentation C3-7  Complications: None  First assistant: Anette Riedel, PA  Implants:Depuy mountaineer lateral mass screw fixation system.  C3: 14 mm screws bilaterally  C4: 14 mm screws bilaterally  C5: Left: 12 mm length screw.  Right: 10 mm length screw  C6: 14 mm length screws bilaterally  C7: Right: 14 mm length screw.  Left: Omitted secondary to disruption of the lateral cortex of the lateral mass.  28 mm cross-link placed at the C5 level. Graft: Autograft, DBX mix (10 cc), and vivogen (5cc)  Indications: This is a very pleasant 57 year old gentleman who underwent an ACDF C3-7 yesterday for cervical spondylitic myelopathy.  Patient returns today for the planned second stage posterior supplemental fixation.  Patient remained intubated but was able to follow commands and moving all 4 extremities prior to surgery.  I spoke with his wife by the telephone prior to start of procedure and make sure all of her questions were addressed.  Operative report  Patient was brought to the operating room.  He was turned prone onto the [facility] spine frame.  All bony prominences well-padded and the arms were tucked at the side and he was secured to the table.  Posterior cervical spine was then prepped and draped in a standard fashion.  X-ray was used to identify the C to T1 spinous processes and then I marked a midline incision out and infiltrated with quarter percent Marcaine with epinephrine.  Made and I sharply dissected using Bovie in the midline down to the deep fascia.  I incised the deep fascia and continued dissecting until I palpated the spinous process of C2.  I then was able to palpate the spinous processes of C3.  I then stripped the paraspinal muscles from the spinous process of the C3-4-5 6 and 7.  I then used a  Cobb to continue my dissection out laterally exposing the entire lateral mass and taking down the facet capsule.  At this point I had exposed from the C3 lateral mass down to the C7 lateral mass.  Went to the contralateral side and performed the same approach.  This point with the posterior elements of the cervical spine exposed self-retaining retractors were placed.  I then used the awl to identify my appropriate starting position at the C3 level.  I used the inferior medial quadrant of the lateral mass and broach the cortex.  Using a high-speed drill I aimed approximately 20 degrees laterally 15 degrees cranially.  This was in line with the lateral mass.  Using fluoroscopy guidance I advanced the drill into the C3 lateral mass.  I then probed the hole tapped the hole and then re-probed the hole.  There was a solid bony canal there was no breach.  Using this exact same technique I placed the drill holes on the right side at C4, 5, 6, and 7.  Each hole was palpated tapped and repalpated to ensure there was a solid bony canal.  I then using the same technique placed drill holes on the contralateral side.  The left C7 lateral mass was very difficult to drill.  During the course of this the drill penetrated out laterally.  Because of the difficulty in the lateral penetration I elected to omit placing the screw on this left C7 lateral mass.  Patient had a ACDF cage that had fixation in the front and had the  right C7 screw and so I did not feel as though omitting the screw significantly compromise the overall stability of the construct.  At this point with all screw holes placed tapped and palpated I decorticated the facet and remaining portion of the lateral mono.  I took down the spinous process tips and morselized to to use as autograft.  Screws were placed as indicated and all of them had excellent fixation.  I then contoured to rod and secured them to the lateral mass screws.  Pulse locked knots were torqued off  according manufacturer standards.  At C5 I placed my cross-link to increase the overall rigidity of the construct.  Locking knots were then also torqued down appropriately.  At this point final x-rays were taken which were satisfactory.  All screws were properly positioned.  I then placed a bone graft which is a combination of autograft and allograft in the posterior aspect and posterior lateral aspect.  Hemostasis was obtained using bipolar cautery.  I did place a deep drain and then closed the deep fascia with interrupted #1 Vicryl sutures.  I then placed a layer of 0 Vicryl sutures to decrease the tension on the skin.  #1 PDS vertical mattress sutures were then placed in a running fashion to reapproximate the skin edges.  Dry dressings were applied.  At the end of the case all needle sponge counts were correct.  There are no adverse intraoperative events.  Patient was extubated at the conclusion of the case.

## 2018-06-07 NOTE — Anesthesia Postprocedure Evaluation (Signed)
Anesthesia Post Note  Patient: Brett Humphrey  Procedure(s) Performed: Posterior spinal fusion interbody C3-7 (N/A )     Patient location during evaluation: PACU Anesthesia Type: General Level of consciousness: awake and alert Pain management: pain level controlled Vital Signs Assessment: post-procedure vital signs reviewed and stable Respiratory status: spontaneous breathing, nonlabored ventilation and respiratory function stable Cardiovascular status: blood pressure returned to baseline and stable Postop Assessment: no apparent nausea or vomiting Anesthetic complications: no    Last Vitals:  Vitals:   06/07/18 1323 06/07/18 1324  BP:    Pulse: 87   Resp: 17   Temp:  (!) 36.2 C  SpO2: 100%     Last Pain:  Vitals:   06/07/18 1300  TempSrc:   PainSc: 10-Worst pain ever                 Lowella Curb

## 2018-06-07 NOTE — Progress Notes (Signed)
    Subjective: Procedure(s) (LRB): Posterior spinal fusion interbody C3-7 (N/A) Day of Surgery  Patient reports pain as N/A.  remains intubated and sedated in ICU s/p 4 level ACDF.  Reports N/A arm pain No significant swelling is noted at incision N/A void - foley remains in place Negative bowel movement Negative flatus N/A chest pain or shortness of breath  Objective: Vital signs in last 24 hours: Temp:  [97.7 F (36.5 C)-98.7 F (37.1 C)] 97.7 F (36.5 C) (09/05 0400) Pulse Rate:  [67-101] 83 (09/05 0500) Resp:  [16-21] 17 (09/05 0500) BP: (108-152)/(68-109) 137/87 (09/05 0500) SpO2:  [95 %-100 %] 97 % (09/05 0500) Arterial Line BP: (86-185)/(68-93) 156/77 (09/05 0500) FiO2 (%):  [40 %-60 %] 40 % (09/05 0415) Weight:  [79.9 kg-81.1 kg] 81.1 kg (09/04 1700)  Intake/Output from previous day: 09/04 0701 - 09/05 0700 In: 2928.1 [I.V.:2928.1] Out: 2250 [Urine:2150; Blood:100]  Labs: Recent Labs    06/07/18 0500  WBC 7.9  RBC 4.66  HCT 43.5  PLT 235   Recent Labs    06/07/18 0500  NA 140  K 4.0  CL 103  CO2 25  BUN 12  CREATININE 1.07  GLUCOSE 133*  CALCIUM 8.7*   No results for input(s): LABPT, INR in the last 72 hours.  Physical Exam: Neurologically intact Dorsiflexion/Plantar flexion intact Incision: dressing C/D/I and no drainage Compartment soft Patient does follow commands - moving all 4 extremities Body mass index is 25.65 kg/m.  Assessment/Plan: Patient stable  xrays n/a Mobilization with physical therapy Encourage incentive spirometry Continue care  Plan on stage 2 of surgery - PSFI C3-7 this AM Anticipate extubation today either at conclusion of case or later today as long as no issues develop Appreciate ICU input Will re-admit post-op to ICU for ongoing observation for at least 24 hrs post-extubation Consent signed - no family at bedside.  However, surgical plan was reviewed yesterday with the patient and his wife.  Risks were also  discussed at that time.  Venita Lick, MD [facility] [phone]

## 2018-06-07 NOTE — Care Management Note (Signed)
Case Management Note  Patient Details  Name: Brett Humphrey MRN: [id number] Date of Birth: January 30, 1961  Subjective/Objective:              C Spine Sx      Action/Plan:  Patient returned to room from OR. In C collar. PT OT evals pending. Patient states plan is for him to potentially return home at DC Saturday/ Sunday after cleared by PT and surgeon.  He deferred further discussion at this time as he was drowsy from surgery.  CM will continue to follow.   Expected Discharge Date:  06/08/18               Expected Discharge Plan:     In-House Referral:     Discharge planning Services  CM Consult  Post Acute Care Choice:    Choice offered to:     DME Arranged:    DME Agency:     HH Arranged:    HH Agency:     Status of Service:  In process, will continue to follow  If discussed at Long Length of Stay Meetings, dates discussed:    Additional Comments:  Lawerance Sabal, RN 06/07/2018, 3:33 PM

## 2018-06-08 ENCOUNTER — Encounter (HOSPITAL_COMMUNITY): Payer: Self-pay | Admitting: Orthopedic Surgery

## 2018-06-08 MED ORDER — CEFAZOLIN SODIUM-DEXTROSE 2-4 GM/100ML-% IV SOLN
2.0000 g | Freq: Three times a day (TID) | INTRAVENOUS | Status: DC
  Administered 2018-06-08 – 2018-06-10 (×7): 2 g via INTRAVENOUS
  Filled 2018-06-08 (×8): qty 100

## 2018-06-08 MED ORDER — OXYCODONE HCL ER 15 MG PO T12A
15.0000 mg | EXTENDED_RELEASE_TABLET | Freq: Two times a day (BID) | ORAL | Status: DC
  Administered 2018-06-08 – 2018-06-09 (×4): 15 mg via ORAL
  Filled 2018-06-08 (×4): qty 1

## 2018-06-08 MED ORDER — SODIUM CHLORIDE 0.9 % IV SOLN
INTRAVENOUS | Status: DC | PRN
  Administered 2018-06-08: 09:00:00 via INTRAVENOUS

## 2018-06-08 MED ORDER — DIAZEPAM 5 MG PO TABS
10.0000 mg | ORAL_TABLET | Freq: Four times a day (QID) | ORAL | Status: DC | PRN
  Administered 2018-06-08 – 2018-06-10 (×4): 10 mg via ORAL
  Filled 2018-06-08 (×4): qty 2

## 2018-06-08 MED FILL — Thrombin For Soln Kit 20000 Unit: CUTANEOUS | Qty: 1 | Status: AC

## 2018-06-08 NOTE — Evaluation (Signed)
Occupational Therapy Evaluation Patient Details Name: Brett Humphrey MRN: [id number] DOB: 04-27-1961 Today's Date: 06/08/2018    History of Present Illness pt is a 57 y/o male with pmh significant for panic d/o, HTN, OSA, admitted with cervical spndylitic myelopathy with multilevel DDD at C3-7, pt s/p 2 stage fusion at C3-7, 9/4 ACDF and 9/5 PCDF.   Clinical Impression   Unsure of Pt's PLOF, as Pt's recall of information was impacted/distracted by pain levels. Pt was able to peform transfers and mobility with min to mod A and RW, able to perform peri care with set up and grooming with set up in seated position. Pt is unable to cross legs to knees at this time and requires max A for LB ADL. Pt will benefit from skilled OT in the acute setting as well as afterwards at the [facility] level to maximize safety and independence in ADL and functional transfers.     Follow Up Recommendations  Home health OT    Equipment Recommendations  3 in 1 bedside commode    Recommendations for Other Services       Precautions / Restrictions Precautions Precautions: Cervical;Fall Precaution Booklet Issued: Yes (comment) Precaution Comments: reviewed in full Required Braces or Orthoses: Cervical Brace Cervical Brace: At all times;Hard collar Restrictions Weight Bearing Restrictions: No      Mobility Bed Mobility Overal bed mobility: Needs Assistance Bed Mobility: Rolling;Sidelying to Sit Rolling: Min assist;Mod assist Sidelying to sit: Min assist;Mod assist       General bed mobility comments: cues for technique, stability assist where support needed to move with pain.  Transfers Overall transfer level: Needs assistance   Transfers: Sit to/from Stand Sit to Stand: Min assist         General transfer comment: pt asking for no assist, but not able with minimal stability assist . Cues for best technique to decrease pain.    Balance Overall balance assessment: No apparent balance deficits (not  formally assessed)                                         ADL either performed or assessed with clinical judgement   ADL Overall ADL's : Needs assistance/impaired Eating/Feeding: Set up Eating/Feeding Details (indicate cue type and reason): able to bring hands to mouth and reports no difficulty in holding onto things - pain limiting assessment Grooming: Wash/dry face;Set up;Sitting   Upper Body Bathing: Maximal assistance   Lower Body Bathing: Maximal assistance   Upper Body Dressing : Maximal assistance   Lower Body Dressing: Maximal assistance   Toilet Transfer: Minimal assistance;Moderate assistance;Cueing for sequencing;Ambulation;RW;Grab bars;Comfort height toilet(BSC over toilet) Toilet Transfer Details (indicate cue type and reason): cues for safety, mod A for power up/down Toileting- Clothing Manipulation and Hygiene: Minimal assistance       Functional mobility during ADLs: Minimal assistance;Rolling walker;Cueing for safety;Cueing for sequencing General ADL Comments: Pt limited by pain, but also peristent in attempting his own ADL. unable to bring less cross to knees     Vision   Additional Comments: verbal cues to keep eyes open throughout     Perception     Praxis      Pertinent Vitals/Pain Pain Assessment: Faces Faces Pain Scale: Hurts worst Pain Location: neck Pain Descriptors / Indicators: Discomfort;Guarding;Moaning;Sore Pain Intervention(s): Patient requesting pain meds-RN notified;Monitored during session;Limited activity within patient's tolerance     Hand Dominance Right  Extremity/Trunk Assessment Upper Extremity Assessment Upper Extremity Assessment: Generalized weakness   Lower Extremity Assessment Lower Extremity Assessment: Defer to PT evaluation   Cervical / Trunk Assessment Cervical / Trunk Assessment: Other exceptions Cervical / Trunk Exceptions: s/p cervical sx   Communication Communication Communication: No  difficulties   Cognition Arousal/Alertness: Awake/alert Behavior During Therapy: Flat affect;Agitated Overall Cognitive Status: Difficult to assess                                 General Comments: Answering questions asked, but then wanting to stop the questioning, stop the loud noises and in escense shut down.   General Comments       Exercises     Shoulder Instructions      Home Living Family/patient expects to be discharged to:: Private residence Living Arrangements: Spouse/significant other Available Help at Discharge: Family Type of Home: House Home Access: Stairs to enter Secretary/administrator of Steps: 1 Entrance Stairs-Rails: None Home Layout: One level     Bathroom Shower/Tub: Chief Strategy Officer: Standard                Prior Functioning/Environment          Comments: has not worked the past month and a half        OT Problem List: Decreased range of motion;Decreased activity tolerance;Impaired balance (sitting and/or standing);Decreased safety awareness;Decreased knowledge of use of DME or AE;Decreased knowledge of precautions;Pain      OT Treatment/Interventions: Self-care/ADL training;Energy conservation;DME and/or AE instruction;Therapeutic activities;Patient/family education;Balance training    OT Goals(Current goals can be found in the care plan section) Acute Rehab OT Goals Patient Stated Goal: not stated on eval due to pain., ADL Goals Pt Will Perform Grooming: with modified independence;standing Pt Will Perform Upper Body Dressing: with min guard assist;sitting Pt Will Perform Lower Body Dressing: with min guard assist;sit to/from stand Pt Will Transfer to Toilet: with supervision;ambulating Pt Will Perform Toileting - Clothing Manipulation and hygiene: with supervision;sit to/from stand Additional ADL Goal #1: Pt will perform bed mobility as precursor to ADL participation at supervision level  OT Frequency:  Min 3X/week   Barriers to D/C:            Co-evaluation PT/OT/SLP Co-Evaluation/Treatment: Yes Reason for Co-Treatment: Complexity of the patient's impairments (multi-system involvement);For patient/therapist safety PT goals addressed during session: Mobility/safety with mobility OT goals addressed during session: ADL's and self-care;Proper use of Adaptive equipment and DME      AM-PAC PT "6 Clicks" Daily Activity     Outcome Measure Help from another person eating meals?: A Little Help from another person taking care of personal grooming?: A Little Help from another person toileting, which includes using toliet, bedpan, or urinal?: A Little Help from another person bathing (including washing, rinsing, drying)?: A Lot Help from another person to put on and taking off regular upper body clothing?: A Lot Help from another person to put on and taking off regular lower body clothing?: A Lot 6 Click Score: 15   End of Session Equipment Utilized During Treatment: Gait belt;Rolling walker;Cervical collar Nurse Communication: Mobility status;Precautions  Activity Tolerance: Patient limited by pain Patient left: in bed;with call bell/phone within reach;with bed alarm set;with nursing/sitter in room;with SCD's reapplied  OT Visit Diagnosis: Unsteadiness on feet (R26.81);Other abnormalities of gait and mobility (R26.89);Muscle weakness (generalized) (M62.81);Pain Pain - part of body: (cervical)  Time: 1025-1110 OT Time Calculation (min): 45 min Charges:  OT General Charges $OT Visit: 1 Visit OT Evaluation $OT Eval Moderate Complexity: 1 Mod  Sherryl Manges OTR/L Acute Rehabilitation Services Pager: [phone] Office: [phone]  Evern Bio Quay Simkin 06/08/2018, 4:16 PM

## 2018-06-08 NOTE — Evaluation (Addendum)
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  Clinical Impression  Pt admitted with/for elective cervical fusion.  Pt needing min to mod assist for basic mobility and gait due to extreme pain.  Pt currently limited functionally due to the problems listed below.  (see problems list.)  Pt will benefit from PT to maximize function and safety to be able to get home safely with available assist .     Follow Up Recommendations Home health PT;Other (comment);Supervision/Assistance - 24 hour(may end up needing no follow up.)    Equipment Recommendations  Rolling walker with 5" wheels;3in1 (PT);Other (comment)(unable to determine what equipment pt has.)    Recommendations for Other Services       Precautions / Restrictions Precautions Precautions: Cervical;Fall Precaution Booklet Issued: Yes (comment) Precaution Comments: reviewed in full Required Braces or Orthoses: Cervical Brace Cervical Brace: At all times;Hard collar Restrictions Weight Bearing Restrictions: No      Mobility  Bed Mobility Overal bed mobility: Needs Assistance Bed Mobility: Rolling;Sidelying to Sit Rolling: Min assist;Mod assist Sidelying to sit: Min assist;Mod assist       General bed mobility comments: cues for technique, stability assist where support needed to move with pain.  Transfers Overall transfer level: Needs assistance   Transfers: Sit to/from Stand Sit to Stand: Min assist         General transfer comment: pt asking for no assist, but not able with minimal stability assist . Cues for best technique to decrease pain.  Ambulation/Gait Ambulation/Gait assistance: Min assist Gait Distance (Feet): 15  Feet Assistive device: Rolling walker (2 wheeled) Gait Pattern/deviations: Step-through pattern   Gait velocity interpretation: <1.31 ft/sec, indicative of household ambulator General Gait Details: short, tentative steps with flexed posture and moderate need for the RW.  Stairs            Wheelchair Mobility    Modified Rankin (Stroke Patients Only)       Balance Overall balance assessment: No apparent balance deficits (not formally assessed)                                           Pertinent Vitals/Pain Pain Assessment: Faces Faces Pain Scale: Hurts worst Pain Location: neck Pain Descriptors / Indicators: Discomfort;Guarding;Moaning;Sore Pain Intervention(s): Patient requesting pain meds-RN notified;Monitored during session;Limited activity within patient's tolerance    Home Living Family/patient expects to be discharged to:: Private residence Living Arrangements: Spouse/significant other Available Help at Discharge: Family Type of Home: House Home Access: Stairs to enter Entrance Stairs-Rails: None Secretary/administrator of Steps: 1 Home Layout: One level        Prior Function           Comments: 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      General Comments General comments (skin integrity, edema, etc.): Initiated bed mobility  education along with basic precautions.  Pt unable to focus on education today.    Exercises     Assessment/Plan    PT Assessment Patient needs continued PT services  PT Problem List Decreased strength;Decreased activity tolerance;Decreased mobility;Decreased knowledge of use of DME;Decreased knowledge of precautions;Pain       PT Treatment Interventions Gait training;Stair training;Functional mobility training;Therapeutic activities;Patient/family education    PT Goals (Current goals can be found in the Care Plan section)  Acute Rehab PT Goals Patient Stated Goal: not stated on eval due to pain., PT Goal Formulation: Patient unable to participate in goal setting Time For Goal Achievement: 06/15/18 Potential to Achieve Goals: Good    Frequency Min 5X/week   Barriers to discharge        Co-evaluation PT/OT/SLP Co-Evaluation/Treatment: Yes Reason for Co-Treatment: Complexity of the patient's impairments (multi-system involvement);For patient/therapist safety PT goals addressed during session: Mobility/safety with mobility OT goals addressed during session: ADL's and self-care       AM-PAC PT "6 Clicks" Daily Activity  Outcome Measure Difficulty turning over in bed (including adjusting bedclothes, sheets and blankets)?: Unable Difficulty moving from lying on back to sitting on the side of the bed? : Unable Difficulty sitting down on and standing up from a chair with arms (e.g., wheelchair, bedside commode, etc,.)?: Unable Help needed moving to and from a bed to chair (including a wheelchair)?: A Little Help needed walking in hospital room?: A Little Help needed climbing 3-5 steps with a railing? : A Little 6 Click Score: 12    End of Session   Activity Tolerance: Patient limited by pain Patient left: in bed;with call bell/phone within reach;with nursing/sitter in room Nurse Communication: Mobility status PT Visit Diagnosis: Other abnormalities of gait and mobility  (R26.89);Pain Pain - part of body: (neck)    Time: 8756-4332 PT Time Calculation (min) (ACUTE ONLY): 46 min   Charges:   PT Evaluation $PT Eval Moderate Complexity: 1 Mod PT Treatments $Therapeutic Activity: 8-22 mins        06/08/2018  Export Bing, PT [phone]  (pager)  Eliseo Gum Lindley Stachnik 06/08/2018, 12:35 PM

## 2018-06-08 NOTE — Plan of Care (Signed)
Discussed with pt and his wife pain medications ordered and the need for the pt to express his need for pain management in a timely manner in order to properly control his pain level.

## 2018-06-08 NOTE — Progress Notes (Signed)
Brett Humphrey  QMV:[id number] DOB: 1961/07/09 DOA: 06/06/2018 PCP: Wilfrid Lund, PA    LOS: 2 days   Reason for Consult / Chief Complaint:  Vent management  Consulting MD and date of consult:  Shon Baton 9/4  HPI/summary of hospital stay:  Patient is a 57 year old Caucasian male with past medical history of hyperlipidemia hypertension obstructive sleep apnea here for elective surgery for degenerative disc disease anterior approach C3-C7 fusion. Post operatively he was taken to the ICU on the ventilator in preparation for the posterior approach the following day. The second procedure was without complication and he was extubated postoperatively. Transferred back to ICU for recovery.   Subjective  Postoperative pain continues. Was able to use CPAP last night without difficulty.   Assessment & Plan:   Inability to protect airway in Periop setting: Now extubated after stage 2 of surgery with no plans for further OR. - Supplemental O2 to keep sats > 90%. - Close monitoring and supplemental care.   OSA on CPAP - CPAP QHS, autotitrate - Take care with sedating medications  Hypertension - Home antihypertensives resumed.   Cervical spondylotic myelopathy S/p C3-7 interbody fusion.  - management per ortho - Pain management  PCCM will sign off. Have paged Dr. Shon Baton. If he would like [place] to have the hospitalists pick up medical management in the morning, I will give them a call.    Best Practice / Goals of Care / Disposition.   DVT prophylaxis: SCD GI prophylaxis: pepcid Diet: NPO - advance as tolerated Mobility: Bed rest Code Status: FULL Family Communication: Patient and family updated.   Disposition / Summary of Today's Plan 06/08/18   Now transferred out to SDU. Still with some post-operative pain, but progressing as suspected.   PCCM will sign off.   Consultants: date of consult/date signed off (if applicable)/final recs   CCM  Procedures:   Significant Diagnostic  Tests:   Micro Data:   Antimicrobials:  Periop Ancef   Objective    Examination: General: middle aged male of normal body habitus in NAD HENT: C-collar in place. PERRL, no appreciable JVD Lungs: Clear, unlabored Cardiovascular: RRR, no MRG Abdomen: Soft, non-tender, non-distended Extremities: No acute deformity  Neuro: alert, oriented  Blood pressure 120/74, pulse 88, temperature 99.6 F (37.6 C), temperature source Axillary, resp. rate 18, height 5\' 10"  (1.778 m), weight 81.1 kg, SpO2 99 %.        Intake/Output Summary (Last 24 hours) at 06/08/2018 1317 Last data filed at 06/08/2018 1000 Gross per 24 hour  Intake 2688.53 ml  Output 3575 ml  Net -886.47 ml   Filed Weights   06/06/18 0808 06/06/18 1700  Weight: 79.9 kg 81.1 kg     Labs    CBC: Recent Labs  Lab 06/07/18 0500  WBC 7.9  NEUTROABS 6.4  HGB 13.9  HCT 43.5  MCV 93.3  PLT 235   Basic Metabolic Panel: Recent Labs  Lab 06/07/18 0500  NA 140  K 4.0  CL 103  CO2 25  GLUCOSE 133*  BUN 12  CREATININE 1.07  CALCIUM 8.7*  MG 2.1   GFR: Estimated Creatinine Clearance: 78.6 mL/min (by C-G formula based on SCr of 1.07 mg/dL). Recent Labs  Lab 06/07/18 0500  WBC 7.9   Liver Function Tests: Recent Labs  Lab 06/07/18 0500  AST 23  ALT 16  ALKPHOS 46  BILITOT 1.1  PROT 5.9*  ALBUMIN 3.4*   No results for input(s): LIPASE, AMYLASE  in the last 168 hours. No results for input(s): AMMONIA in the last 168 hours. ABG    Component Value Date/Time   PHART 7.374 06/06/2018 1737   PCO2ART 47.7 06/06/2018 1737   PO2ART 239.0 (H) 06/06/2018 1737   HCO3 27.9 06/06/2018 1737   TCO2 29 06/06/2018 1737   O2SAT 100.0 06/06/2018 1737    Coagulation Profile: No results for input(s): INR, PROTIME in the last 168 hours. Cardiac Enzymes: No results for input(s): CKTOTAL, CKMB, CKMBINDEX, TROPONINI in the last 168 hours. HbA1C: No results found for: HGBA1C CBG: No results for input(s): GLUCAP in  the last 168 hours.  [name], AGACNP-BC [facility] Pulmonology/Critical Care Pager [phone] or [phone]  06/08/2018 1:17 PM

## 2018-06-08 NOTE — Progress Notes (Addendum)
    Subjective: 1 Day Post-Op Procedure(s) (LRB): Posterior spinal fusion interbody C3-7 (N/A) Patient reports pain as 10 on 0-10 scale.   Denies CP or SOB.  Voiding without difficulty. Positive flatus.Pt said he is in too much pain to walk or eat Objective: Vital signs in last 24 hours: Temp:  [97.2 F (36.2 C)-100.7 F (38.2 C)] 100.7 F (38.2 C) (09/06 0400) Pulse Rate:  [79-107] 79 (09/06 0700) Resp:  [8-24] 11 (09/06 0700) BP: (129-163)/(69-99) 129/69 (09/06 0700) SpO2:  [87 %-100 %] 92 % (09/06 0700) Arterial Line BP: (98-210)/(76-130) 98/82 (09/06 0200)  Intake/Output from previous day: 09/05 0701 - 09/06 0700 In: 3608 [P.O.:860; I.V.:2648; IV Piggyback:100] Out: 4575 [Urine:4150; Drains:125; Blood:300] Intake/Output this shift: No intake/output data recorded.  Labs: Recent Labs    06/07/18 0500  HGB 13.9   Recent Labs    06/07/18 0500  WBC 7.9  RBC 4.66  HCT 43.5  PLT 235   Recent Labs    06/07/18 0500  NA 140  K 4.0  CL 103  CO2 25  BUN 12  CREATININE 1.07  GLUCOSE 133*  CALCIUM 8.7*   No results for input(s): LABPT, INR in the last 72 hours.  Physical Exam: Neurologically intact ABD soft Sensation intact distally Dorsiflexion/Plantar flexion intact Incision: drain has output of 75cc this am Compartment soft Body mass index is 25.65 kg/m.   Assessment/Plan: 1 Day Post-Op Procedure(s) (LRB): Posterior spinal fusion interbody C3-7 (N/A) Advance diet Up with therapy Pt is getting transferred to the floor Added a extended release oxy to assist with pain control Encouraged the pt to get out of bed and eat Did not pull drain because continues to have significant output.  [place], Baxter K[place] for Dr. Venita Lick [facility] [phone] 06/08/2018, 7:48 AM  Patient complains of posterior incisional pain. Neuro exam stable.  NO acute deficits Pain expected - will adjust meds accordingly Continue to encourage  mobilization Drain output remains low - will d/c in AM Valium instead of robaxin for muscle spams

## 2018-06-09 NOTE — Anesthesia Postprocedure Evaluation (Signed)
Anesthesia Post Note  Patient: Brett Humphrey  Procedure(s) Performed: ANTERIOR CERVICAL DECOMPRESSION/DISCECTOMY FUSION C3-7 (N/A Spine Cervical)     Patient location during evaluation: PACU Anesthesia Type: General Level of consciousness: patient remains intubated per anesthesia plan Pain management: pain level controlled Vital Signs Assessment: post-procedure vital signs reviewed and stable Respiratory status: patient remains intubated per anesthesia plan Cardiovascular status: stable Postop Assessment: no apparent nausea or vomiting Anesthetic complications: no    Last Vitals:  Vitals:   06/08/18 2320 06/09/18 0349  BP: 134/80 (!) 133/92  Pulse: 89 98  Resp: 13 16  Temp: 36.9 C 37.1 C  SpO2: 100% 100%    Last Pain:  Vitals:   06/09/18 0534  TempSrc:   PainSc: 7                  Brett Humphrey

## 2018-06-09 NOTE — Progress Notes (Signed)
Patient ID: Brett Humphrey, male   DOB: 1961/01/02, 57 y.o.   MRN: [id number] Subjective: 2 Days Post-Op Procedure(s) (LRB): Posterior spinal fusion interbody C3-7 (N/A)    Patient reports pain as severe.  Though notes was able to get some sleep last night his pain remains in the 7-8/10 range despite oral and iv dilaudid.  Does note improvement in pre-operative UE and LE pain and issues, just neck pain   Objective:   VITALS:   Vitals:   06/08/18 2320 06/09/18 0349  BP: 134/80 (!) 133/92  Pulse: 89 98  Resp: 13 16  Temp: 98.5 F (36.9 C) 98.8 F (37.1 C)  SpO2: 100% 100%    Neurovascular intact Incision: dressing C/D/I  Drain out put still greater than 30cc per shift but appears to be slowing  LABS Recent Labs    06/07/18 0500  HGB 13.9  HCT 43.5  WBC 7.9  PLT 235    Recent Labs    06/07/18 0500  NA 140  K 4.0  BUN 12  CREATININE 1.07  GLUCOSE 133*    No results for input(s): LABPT, INR in the last 72 hours.   Assessment/Plan: 2 Days Post-Op Procedure(s) (LRB): Posterior spinal fusion interbody C3-7 (N/A)   Up with therapy  Per Shon Baton if drain output continues to slow drain can be removed. Continue with pain control D/C plan once drain out and pain more manageable with oral agents Otherwise progress as tolerable

## 2018-06-09 NOTE — Evaluation (Signed)
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   Clinical Impression   Patient progressing well.  Demonstrates ability to complete bed mobility with min guard assist, LB dressing with min guard assist (demosntrating ability to complete figure 4 technique with supervision today), toilet transfers with min guard assistance, and grooming standing with min guard assistance.  Requires cueing for safety awareness, hand placement during transfers, and adherence to cervical precautions.  Patient educated on shower safety, bathing seated, and will benefit from continued education with shower transfer safety, plan on for next session.   DC plan remains appropriate.     Follow Up Recommendations  Home health OT;Supervision/Assistance - 24 hour    Equipment Recommendations  3 in 1 bedside commode    Recommendations for Other Services       Precautions / Restrictions Precautions Precautions: Cervical;Fall Precaution Booklet Issued: Yes (comment) Precaution Comments: reviewed in full Required Braces or Orthoses: Cervical Brace Cervical Brace: Hard collar(may remove in bed and apply in sitting ) Restrictions Weight Bearing Restrictions: No      Mobility Bed Mobility Overal bed mobility: Needs Assistance Bed Mobility: Sidelying to Sit Rolling: Min guard Sidelying to sit: Min assist       General bed mobility comments: cues for technique, stability assist where support needed to move with pain.  Transfers Overall transfer level: Needs assistance Equipment used: Rolling walker (2 wheeled) Transfers: Sit to/from Stand Sit to Stand: Min guard         General transfer comment: cueing for  hand placement     Balance Overall balance assessment: Needs assistance Sitting-balance support: No upper extremity supported;Feet supported Sitting balance-Leahy Scale: Fair     Standing balance support: No upper extremity supported;During functional activity Standing balance-Leahy Scale: Poor Standing balance comment: min guard required for balance with 0 hand support during grooming tasks standign                           ADL either performed or assessed with clinical judgement   ADL Overall ADL's : Needs assistance/impaired     Grooming: Wash/dry face;Oral care;Standing;Min guard(oral care mouth wash only, declined to brush teeth) Grooming Details (indicate cue type and reason): min guard for safety and balance             Lower Body Dressing: Min guard;Sit to/from stand;Cueing for compensatory techniques Lower Body Dressing Details (indicate cue type and reason): patient able to complete figure 4 technique today, min guard once standing  Toilet Transfer: Min guard;Ambulation;BSC;RW Toilet Transfer Details (indicate cue type and reason): to 3:1 commode, educated on use for over toilet at home; cueing for hand placement and safety       Tub/Shower Transfer Details (indicate cue type and reason): reviewed options for shower transfers at home; reports having walk in shower and tub shower options; reports "maybe I can just stand or get down into the tub" educated on safety and precautions with bathing, pt voiced understanding  Functional mobility during ADLs: Min guard;Rolling walker General ADL Comments: continues to be limited by pain, decreased safety awareness noted      Vision  Perception     Praxis      Pertinent Vitals/Pain Pain Assessment: Faces Faces Pain Scale: Hurts whole lot Pain Location: neck Pain Descriptors / Indicators: Discomfort;Guarding;Sore Pain Intervention(s): Limited activity within patient's tolerance;Repositioned      Hand Dominance     Extremity/Trunk Assessment             Communication     Cognition Arousal/Alertness: Awake/alert Behavior During Therapy: Flat affect Overall Cognitive Status: Within Functional Limits for tasks assessed                                     General Comments       Exercises     Shoulder Instructions      Home Living                                          Prior Functioning/Environment                   OT Problem List:        OT Treatment/Interventions:      OT Goals(Current goals can be found in the care plan section) Acute Rehab OT Goals Patient Stated Goal: to have less pain and get home   OT Frequency: Min 3X/week   Barriers to D/C:            Co-evaluation              AM-PAC PT "6 Clicks" Daily Activity     Outcome Measure Help from another person eating meals?: A Little Help from another person taking care of personal grooming?: A Little Help from another person toileting, which includes using toliet, bedpan, or urinal?: A Little Help from another person bathing (including washing, rinsing, drying)?: A Little Help from another person to put on and taking off regular upper body clothing?: A Little Help from another person to put on and taking off regular lower body clothing?: A Little 6 Click Score: 18   End of Session Equipment Utilized During Treatment: Gait belt;Rolling walker;Cervical collar Nurse Communication: Mobility status;Precautions  Activity Tolerance: Patient limited by pain Patient left: in bed;with call bell/phone within reach  OT Visit Diagnosis: Unsteadiness on feet (R26.81);Other abnormalities of gait and mobility (R26.89);Muscle weakness (generalized) (M62.81);Pain Pain - part of body: (neck)                Time: 9417-4081 OT Time Calculation (min): 22 min Charges:  OT General Charges $OT Visit: 1 Visit OT Treatments $Self Care/Home Management : 8-22  mins  Chancy Milroy, OT Acute Rehabilitation Services Pager [phone] Office [phone]   Chancy Milroy 06/09/2018, 4:47 PM

## 2018-06-09 NOTE — Progress Notes (Signed)
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    PT Comments    Pt admitted with above diagnosis. Pt currently with functional limitations due to the deficits listed below (see PT Problem List). Pt was able to ambulate with RW with good safety awareness overall in hallway with min guard assist.  Progressing well.   Pt will benefit from skilled PT to increase their independence and safety with mobility to allow discharge to the venue listed below.     Follow Up Recommendations  Home health PT;Other (comment);Supervision/Assistance - 24 hour(may end up needing no follow up.)     Equipment Recommendations  Rolling walker with 5" wheels;3in1 (PT);Other (comment)(unable to determine what equipment pt has.)    Recommendations for Other Services       Precautions / Restrictions Precautions Precautions: Cervical;Fall Precaution Booklet Issued: Yes (comment) Precaution Comments: reviewed in full Required Braces or Orthoses: Cervical Brace Cervical Brace: Hard collar(may remove while in bed and apply in sitting) Restrictions Weight Bearing Restrictions: No    Mobility  Bed Mobility Overal bed mobility: Needs Assistance Bed Mobility: Rolling;Sidelying to Sit Rolling: Min guard Sidelying to sit: Min assist       General bed mobility comments: cues for technique, stability assist where support needed to move with pain.  Transfers Overall transfer level: Needs assistance Equipment used: Rolling walker (2 wheeled) Transfers: Sit to/from Stand Sit to Stand: Min guard         General transfer comment: cues for technique only.   Ambulation/Gait Ambulation/Gait assistance: Min guard Gait  Distance (Feet): 380 Feet Assistive device: Rolling walker (2 wheeled) Gait Pattern/deviations: Step-through pattern;Decreased stride length   Gait velocity interpretation: 1.31 - 2.62 ft/sec, indicative of limited community ambulator General Gait Details: short, tentative steps with slightly flexed posture and light pressure on RW for balance.    Stairs             Wheelchair Mobility    Modified Rankin (Stroke Patients Only)       Balance Overall balance assessment: Needs assistance Sitting-balance support: No upper extremity supported;Feet supported Sitting balance-Leahy Scale: Fair     Standing balance support: Bilateral upper extremity supported;During functional activity Standing balance-Leahy Scale: Poor Standing balance comment: relies on RW for balance                             Cognition Arousal/Alertness: Awake/alert Behavior During Therapy: Flat affect Overall Cognitive Status: Within Functional Limits for tasks assessed                                        Exercises      General Comments        Pertinent Vitals/Pain Pain Assessment: Faces Faces Pain Scale: Hurts whole lot Pain Location: neck Pain Descriptors / Indicators: Discomfort;Guarding;Moaning;Sore Pain Intervention(s): Limited activity within patient's tolerance;Monitored during session;Premedicated before session;Repositioned    Home Living                      Prior Function  PT Goals (current goals can now be found in the care plan section) Acute Rehab PT Goals Patient Stated Goal: not stated on eval due to pain., Progress towards PT goals: Progressing toward goals    Frequency    Min 5X/week      PT Plan Current plan remains appropriate    Co-evaluation              AM-PAC PT "6 Clicks" Daily Activity  Outcome Measure  Difficulty turning over in bed (including adjusting bedclothes, sheets and blankets)?:  Unable Difficulty moving from lying on back to sitting on the side of the bed? : Unable Difficulty sitting down on and standing up from a chair with arms (e.g., wheelchair, bedside commode, etc,.)?: A Little Help needed moving to and from a bed to chair (including a wheelchair)?: A Little Help needed walking in hospital room?: A Little Help needed climbing 3-5 steps with a railing? : A Little 6 Click Score: 14    End of Session Equipment Utilized During Treatment: Gait belt;Cervical collar Activity Tolerance: Patient limited by pain;Patient tolerated treatment well Patient left: in bed;with call bell/phone within reach Nurse Communication: Mobility status PT Visit Diagnosis: Other abnormalities of gait and mobility (R26.89);Pain Pain - part of body: (neck)     Time: 1610-9604 PT Time Calculation (min) (ACUTE ONLY): 12 min  Charges:  $Gait Training: 8-22 mins                     Lovely Kerins,PT Acute Rehabilitation Services Pager:  [phone]  Office:  [phone]     Berline Lopes 06/09/2018, 4:30 PM

## 2018-06-09 NOTE — Progress Notes (Signed)
Patient is refusing the use of CPAP for tonight. RT informed patient if he changes his mind have RN contact RT. RN aware. 

## 2018-06-10 NOTE — Progress Notes (Signed)
Discharge instructions reviewed with patient and patient's wife.  Patient given written prescriptions, IV's removed.  Patient discharged via wheelchair to home with wife.

## 2018-06-10 NOTE — Plan of Care (Signed)
  Problem: Clinical Measurements: Goal: Ability to maintain clinical measurements within normal limits will improve Outcome: Progressing   Problem: Coping: Goal: Level of anxiety will decrease Outcome: Progressing   Problem: Pain Managment: Goal: General experience of comfort will improve Outcome: Progressing   

## 2018-06-10 NOTE — Progress Notes (Signed)
OT Cancellation Note  Patient Details Name: Brett Humphrey MRN: [id number] DOB: 1960/12/29   Cancelled Treatment:    Reason Eval/Treat Not Completed: Other (comment). Met with pt. And spouse as they were actively preparing for discharge.  Offered review or return demo of stated OT goals. Both pt. And spouse state they feel comfortable and declined any need for physical review prior to home.  Answered spouse's questions regarding DME ie: 3n1.  Both report no other needs RN present and finishing reviewing d/c information with them both.    Janice Coffin , COTA/L 06/10/2018, 10:18 AM

## 2018-06-10 NOTE — Progress Notes (Signed)
Subjective: 3 Days Post-Op Procedure(s) (LRB): Posterior spinal fusion interbody C3-7 (N/A) Patient reports pain as moderate.  Pain has been improving and is controlled now with oral meds.  He denies numbness or weakness in his UEs.  Has been up and walking with a rolling walker.  Wants to go home.  Objective: Vital signs in last 24 hours: Temp:  [97.7 F (36.5 C)-98.7 F (37.1 C)] 98.7 F (37.1 C) (09/08 0345) Pulse Rate:  [86-97] 86 (09/08 0345) Resp:  [18] 18 (09/08 0345) BP: (107-128)/(66-98) 116/71 (09/08 0345) SpO2:  [91 %-97 %] 95 % (09/08 0345)  Intake/Output from previous day: 09/07 0701 - 09/08 0700 In: 625.3 [IV Piggyback:605.3] Out: 530 [Urine:500; Drains:30] Intake/Output this shift: No intake/output data recorded.  No results for input(s): HGB in the last 72 hours. No results for input(s): WBC, RBC, HCT, PLT in the last 72 hours. No results for input(s): NA, K, CL, CO2, BUN, CREATININE, GLUCOSE, CALCIUM in the last 72 hours. No results for input(s): LABPT, INR in the last 72 hours.  PE:  incisions c/d/i at anterior and posterior neck. intact motor and sensory function throughout UEs.    Assessment/Plan: 3 Days Post-Op Procedure(s) (LRB): Posterior spinal fusion interbody C3-7 (N/A) D/c home today.    Brett Humphrey 06/10/2018, 8:22 AM

## 2018-06-10 NOTE — Care Management Note (Signed)
Case Management Note  Patient Details  Name: APOSTOLOS BIERNAT MRN: [id number] Date of Birth: 07-04-1961  Subjective/Objective:                    Action/Plan:  Spoke w patient at bedside. Discussed recommended DME. He states they plan to borrow it from a neighbor. He plans on following up w surgeon and discussing further therapy at that time.  No other CM needs at this time.  Expected Discharge Date:  06/10/18               Expected Discharge Plan:     In-House Referral:     Discharge planning Services  CM Consult  Post Acute Care Choice:    Choice offered to:     DME Arranged:    DME Agency:     HH Arranged:    HH Agency:     Status of Service:  Completed, signed off  If discussed at [employer] of Stay Meetings, dates discussed:    Additional Comments:  Lawerance Sabal, RN 06/10/2018, 10:35 AM

## 2018-06-13 NOTE — Discharge Summary (Signed)
Physician Discharge Summary  Patient ID: Brett Humphrey MRN: [id number] DOB/AGE: September 26, 1961 57 y.o.  Admit date: 06/06/2018 Discharge date: 06/10/18  Admission Diagnoses:  Cervical spondylitic Myelopathy  Discharge Diagnoses:  Active Problems:   DDD (degenerative disc disease), cervical   S/P cervical spinal fusion   Impaired spontaneous ventilation   Endotracheally intubated   Elective surgery   Past Medical History:  Diagnosis Date  . Arthritis    back  . ED (erectile dysfunction)   . Esophageal reflux   . GERD (gastroesophageal reflux disease)   . Hypercholesteremia   . Hyperlipidemia   . Hypertension   . Male hypogonadism   . OSA (obstructive sleep apnea)    Severe w AHI 37/hr now on CPAP at 14cm H2O  . Panic disorder without agoraphobia     Surgeries: Procedure(s): Posterior spinal fusion interbody C3-7 on 06/07/2018   Consultants (if any):   Discharged Condition: Improved  Hospital Course: Brett Humphrey is an 57 y.o. male who was admitted 06/06/2018 with a diagnosis of Cervical spondylitic Myelopathy and went to the operating room on 06/07/2018 and underwent the above named procedures.  Pt underwent a 2 day surgery. Anterior and posterior C3-7 Fusion.  Pt remained intubated and stay in the ICU overnight after Day one. Pt was extubated after 2nd surgery but was sent back to ICU overnight.   Post op day one after both surgeries pt reports significant pain especially of his posterior incision.  Pt was not wanting to eat or drink.  Pain and anxiety meds were adjusted.  Continued to monitor drain output.  Pt was transferred out of ICU to step down unit.  Post op day 2 pt still reports high level of pain but had decreased some from yesterday.  Drain was able to be removed.  Post op day 3 pt reports pain as moderate better controlled on oral medications.  He has been up walking with rolling walker.  Pt cleared by PT and SW for DC. He was given perioperative antibiotics:   Anti-infectives (From admission, onward)   Start     Dose/Rate Route Frequency Ordered Stop   06/08/18 0800  ceFAZolin (ANCEF) IVPB 2g/100 mL premix  Status:  Discontinued     2 g 200 mL/hr over 30 Minutes Intravenous Every 8 hours 06/08/18 0754 06/10/18 1328   06/06/18 1715  ceFAZolin (ANCEF) IVPB 2g/100 mL premix     2 g 200 mL/hr over 30 Minutes Intravenous Every 8 hours 06/06/18 1706 06/07/18 0230   06/06/18 1300  ceFAZolin (ANCEF) IVPB 2g/100 mL premix  Status:  Discontinued     2 g 200 mL/hr over 30 Minutes Intravenous To Surgery 06/06/18 1251 06/06/18 1652   06/06/18 0754  ceFAZolin (ANCEF) IVPB 2g/100 mL premix     2 g 200 mL/hr over 30 Minutes Intravenous 30 min pre-op 06/06/18 0754 06/06/18 1508    .  He was given sequential compression devices, early ambulation, and TED for DVT prophylaxis.  He benefited maximally from the hospital stay and there were no complications.    Recent vital signs:  Vitals:   06/10/18 0345 06/10/18 0830  BP: 116/71   Pulse: 86   Resp: 18   Temp: 98.7 F (37.1 C) 97.7 F (36.5 C)  SpO2: 95%     Recent laboratory studies:  Lab Results  Component Value Date   HGB 13.9 06/07/2018   HGB 15.5 05/28/2018   HGB 14.1 11/14/2017   Lab Results  Component Value Date  WBC 7.9 06/07/2018   PLT 235 06/07/2018   No results found for: INR Lab Results  Component Value Date   NA 140 06/07/2018   K 4.0 06/07/2018   CL 103 06/07/2018   CO2 25 06/07/2018   BUN 12 06/07/2018   CREATININE 1.07 06/07/2018   GLUCOSE 133 (H) 06/07/2018    Discharge Medications:   Allergies as of 06/10/2018      Reactions   Simvastatin     myalgias       Medication List    STOP taking these medications   meloxicam 15 MG tablet Commonly known as:  MOBIC   traMADol 50 MG tablet Commonly known as:  ULTRAM   vardenafil 10 MG tablet Commonly known as:  LEVITRA   zolpidem 10 MG tablet Commonly known as:  AMBIEN     TAKE these medications    atorvastatin 40 MG tablet Commonly known as:  LIPITOR Take 40 mg by mouth daily.   ferrous sulfate 325 (65 FE) MG tablet Take 325 mg by mouth daily with breakfast.   gabapentin 300 MG capsule Commonly known as:  NEURONTIN Take 300 mg by mouth 3 (three) times daily.   losartan 100 MG tablet Commonly known as:  COZAAR Take 100 mg by mouth daily.   lubiprostone 24 MCG capsule Commonly known as:  AMITIZA Take 1 capsule (24 mcg total) by mouth 2 (two) times daily with a meal.   methocarbamol 500 MG tablet Commonly known as:  ROBAXIN Take 1 tablet (500 mg total) by mouth 3 (three) times daily.   multivitamin capsule Take 1 capsule by mouth daily.   nicotine 21 mg/24hr patch Commonly known as:  NICODERM CQ - dosed in mg/24 hours Place 21 mg onto the skin daily.   ondansetron 4 MG disintegrating tablet Commonly known as:  ZOFRAN-ODT Take 1 tablet (4 mg total) by mouth every 8 (eight) hours as needed for nausea or vomiting.   ranitidine 150 MG tablet Commonly known as:  ZANTAC Take 150 mg by mouth 2 (two) times daily.   Testosterone 30 MG/ACT Soln Apply 1 application topically daily.   VASCEPA 1 g Caps Generic drug:  Icosapent Ethyl Take 2 g by mouth 2 (two) times daily.   venlafaxine XR 75 MG 24 hr capsule Commonly known as:  EFFEXOR-XR Take 75 mg by mouth daily with breakfast.     ASK your doctor about these medications   oxyCODONE-acetaminophen 10-325 MG tablet Commonly known as:  PERCOCET Take 1 tablet by mouth every 4 (four) hours as needed for up to 5 days for pain. Ask about: Should I take this medication?       Diagnostic Studies: Dg Cervical Spine 2-3 Views  Result Date: 06/07/2018 CLINICAL DATA:  C3-C7 posterior fusion. EXAM: CERVICAL SPINE - 2-3 VIEW; DG C-ARM 61-120 MIN COMPARISON:  06/06/2018 FINDINGS: Intraoperative views of the cervical spine are submitted postoperatively for interpretation. Interval placement of bilateral posterior rod and  bipedicular screws at C3-C4-C5-C6-C7 noted. Interbody fusion cages are again noted at C3-4, C4-5, C5-6 and C6-7. No gross complicating features are identified. IMPRESSION: Posterior fusion hardware from C3-C7. Electronically Signed   By: Harmon Pier M.D.   On: 06/07/2018 12:11   Dg Cervical Spine 2-3 Views  Result Date: 06/06/2018 CLINICAL DATA:  Anterior cervical decompression and discectomy EXAM: DG C-ARM 61-120 MIN; CERVICAL SPINE - 2-3 VIEW COMPARISON:  None. FINDINGS: Three low resolution intraoperative spot views of the cervical spine. Total fluoroscopy time was 1 minutes  29 seconds. The images demonstrate interbody devices at C3-C4, C4-C5, C5-C6 and C6-C7 with fixating screws at C3-C4 and C6-C7. IMPRESSION: Intraoperative fluoroscopic assistance provided during cervical spine surgery Electronically Signed   By: Jasmine Pang M.D.   On: 06/06/2018 18:00   Dg Chest Port 1 View  Result Date: 06/06/2018 CLINICAL DATA:  Respiratory difficulty EXAM: PORTABLE CHEST 1 VIEW COMPARISON:  05/30/2017 FINDINGS: Endotracheal tube tip is 4.4 cm from the carina. Lung apices are obscured by the patient's head and neck. Subsegmental atelectasis at the lung bases. Normal heart size. IMPRESSION: Limited exam.  Bibasilar atelectasis.  Endotracheal tube placed. Electronically Signed   By: Jolaine Click M.D.   On: 06/06/2018 18:36   Dg C-arm 1-60 Min  Result Date: 06/07/2018 CLINICAL DATA:  C3-C7 posterior fusion. EXAM: CERVICAL SPINE - 2-3 VIEW; DG C-ARM 61-120 MIN COMPARISON:  06/06/2018 FINDINGS: Intraoperative views of the cervical spine are submitted postoperatively for interpretation. Interval placement of bilateral posterior rod and bipedicular screws at C3-C4-C5-C6-C7 noted. Interbody fusion cages are again noted at C3-4, C4-5, C5-6 and C6-7. No gross complicating features are identified. IMPRESSION: Posterior fusion hardware from C3-C7. Electronically Signed   By: Harmon Pier M.D.   On: 06/07/2018 12:11   Dg  C-arm 1-60 Min  Result Date: 06/07/2018 CLINICAL DATA:  C3-C7 posterior fusion. EXAM: CERVICAL SPINE - 2-3 VIEW; DG C-ARM 61-120 MIN COMPARISON:  06/06/2018 FINDINGS: Intraoperative views of the cervical spine are submitted postoperatively for interpretation. Interval placement of bilateral posterior rod and bipedicular screws at C3-C4-C5-C6-C7 noted. Interbody fusion cages are again noted at C3-4, C4-5, C5-6 and C6-7. No gross complicating features are identified. IMPRESSION: Posterior fusion hardware from C3-C7. Electronically Signed   By: Harmon Pier M.D.   On: 06/07/2018 12:11   Dg C-arm 1-60 Min  Result Date: 06/06/2018 CLINICAL DATA:  Anterior cervical decompression and discectomy EXAM: DG C-ARM 61-120 MIN; CERVICAL SPINE - 2-3 VIEW COMPARISON:  None. FINDINGS: Three low resolution intraoperative spot views of the cervical spine. Total fluoroscopy time was 1 minutes 29 seconds. The images demonstrate interbody devices at C3-C4, C4-C5, C5-C6 and C6-C7 with fixating screws at C3-C4 and C6-C7. IMPRESSION: Intraoperative fluoroscopic assistance provided during cervical spine surgery Electronically Signed   By: Jasmine Pang M.D.   On: 06/06/2018 18:00   Dg C-arm 1-60 Min  Result Date: 06/06/2018 CLINICAL DATA:  Anterior cervical decompression and discectomy EXAM: DG C-ARM 61-120 MIN; CERVICAL SPINE - 2-3 VIEW COMPARISON:  None. FINDINGS: Three low resolution intraoperative spot views of the cervical spine. Total fluoroscopy time was 1 minutes 29 seconds. The images demonstrate interbody devices at C3-C4, C4-C5, C5-C6 and C6-C7 with fixating screws at C3-C4 and C6-C7. IMPRESSION: Intraoperative fluoroscopic assistance provided during cervical spine surgery Electronically Signed   By: Jasmine Pang M.D.   On: 06/06/2018 18:00   Dg C-arm 1-60 Min  Result Date: 06/06/2018 CLINICAL DATA:  Anterior cervical decompression and discectomy EXAM: DG C-ARM 61-120 MIN; CERVICAL SPINE - 2-3 VIEW COMPARISON:  None.  FINDINGS: Three low resolution intraoperative spot views of the cervical spine. Total fluoroscopy time was 1 minutes 29 seconds. The images demonstrate interbody devices at C3-C4, C4-C5, C5-C6 and C6-C7 with fixating screws at C3-C4 and C6-C7. IMPRESSION: Intraoperative fluoroscopic assistance provided during cervical spine surgery Electronically Signed   By: Jasmine Pang M.D.   On: 06/06/2018 18:00    Disposition:  Pt will present to clinic in 2 weeks Post op meds provided Discharge Instructions    Call  MD / Call 911   Complete by:  As directed    If you experience chest pain or shortness of breath, CALL 911 and be transported to the hospital emergency room.  If you develope a fever above 101 F, pus (white drainage) or increased drainage or redness at the wound, or calf pain, call your surgeon's office.   Constipation Prevention   Complete by:  As directed    Drink plenty of fluids.  Prune juice may be helpful.  You may use a stool softener, such as Colace (over the counter) 100 mg twice a day.  Use MiraLax (over the counter) for constipation as needed.   Diet - low sodium heart healthy   Complete by:  As directed    Incentive spirometry RT   Complete by:  As directed    Increase activity slowly as tolerated   Complete by:  As directed       Follow-up Information    Venita Lick, MD Follow up in 2 week(s).   Specialty:  Orthopedic Surgery Contact information: [address] [place] [address] [place] [address] [phone]            Signed: Kirt Boys 06/13/2018, 8:25 AM

## 2018-10-08 DIAGNOSIS — M542 Cervicalgia: Secondary | ICD-10-CM | POA: Diagnosis not present

## 2018-10-26 DIAGNOSIS — G4733 Obstructive sleep apnea (adult) (pediatric): Secondary | ICD-10-CM | POA: Diagnosis not present

## 2018-11-01 DIAGNOSIS — I10 Essential (primary) hypertension: Secondary | ICD-10-CM | POA: Diagnosis not present

## 2018-11-01 DIAGNOSIS — R739 Hyperglycemia, unspecified: Secondary | ICD-10-CM | POA: Diagnosis not present

## 2018-11-01 DIAGNOSIS — E782 Mixed hyperlipidemia: Secondary | ICD-10-CM | POA: Diagnosis not present

## 2018-11-01 DIAGNOSIS — Z23 Encounter for immunization: Secondary | ICD-10-CM | POA: Diagnosis not present

## 2018-11-01 DIAGNOSIS — Z0000 Encounter for general adult medical examination without abnormal findings: Secondary | ICD-10-CM | POA: Diagnosis not present

## 2018-11-01 DIAGNOSIS — E291 Testicular hypofunction: Secondary | ICD-10-CM | POA: Diagnosis not present

## 2019-01-25 DIAGNOSIS — G4733 Obstructive sleep apnea (adult) (pediatric): Secondary | ICD-10-CM | POA: Diagnosis not present

## 2019-02-12 DIAGNOSIS — R944 Abnormal results of kidney function studies: Secondary | ICD-10-CM | POA: Diagnosis not present

## 2019-05-02 MED FILL — VENLAFAXINE HCL ER 150 MG C: 150 | 90 days supply | Qty: 90 | Fill #0

## 2019-05-02 MED FILL — ZOLPIDEM TARTRATE 10 MG TAB: 10 | 90 days supply | Qty: 90 | Fill #0

## 2019-05-16 MED FILL — LOSARTAN POTASSIUM 100 MG T: 100 | 90 days supply | Qty: 90 | Fill #0

## 2019-05-24 MED FILL — TESTOSTERONE 30 MG/ACT SOLN: 30 | 30 days supply | Qty: 90 | Fill #0

## 2019-06-12 MED FILL — FAMOTIDINE 20 MG TABLET: 20 | 90 days supply | Qty: 180 | Fill #0

## 2019-07-25 MED FILL — ZOLPIDEM TARTRATE 10 MG TAB: 10 | 90 days supply | Qty: 90 | Fill #1

## 2019-07-29 MED FILL — FLUARIX QUADRIVALENT 0.5 ML: 0.5 | 1 days supply | Qty: 1 | Fill #0

## 2019-07-29 MED FILL — VENLAFAXINE HCL ER 150 MG C: 150 | 90 days supply | Qty: 90 | Fill #1

## 2019-08-16 MED FILL — LOSARTAN POTASSIUM 100 MG T: 100 | 90 days supply | Qty: 90 | Fill #1

## 2019-09-19 MED FILL — ATORVASTATIN 40 MG TABLET: 40 | 90 days supply | Qty: 90 | Fill #0

## 2019-09-19 MED FILL — FAMOTIDINE 20 MG TABS: 20 | 90 days supply | Qty: 180 | Fill #0

## 2019-10-02 MED FILL — VASCEPA 1 GM CAPSULE: 1 | 90 days supply | Qty: 360 | Fill #0

## 2019-10-16 ENCOUNTER — Encounter: Payer: Self-pay | Admitting: *Deleted

## 2019-10-24 MED FILL — VENLAFAXINE HCL ER 150 MG C: 150 | 90 days supply | Qty: 90 | Fill #0

## 2019-10-24 MED FILL — ZOLPIDEM TARTRATE 10 MG TAB: 10 | 90 days supply | Qty: 90 | Fill #0

## 2019-11-14 MED FILL — TESTOSTERONE 30 MG/ACT SOLN: 30 | 90 days supply | Qty: 270 | Fill #0

## 2019-11-14 MED FILL — LOSARTAN POTASSIUM 100 MG T: 100 | 90 days supply | Qty: 90 | Fill #0

## 2019-12-17 MED FILL — FAMOTIDINE 20 MG TABS: 20 | 90 days supply | Qty: 180 | Fill #0

## 2019-12-26 MED FILL — VASCEPA 1 GM CAPSULE: 1 | 90 days supply | Qty: 360 | Fill #0

## 2019-12-26 MED FILL — ATORVASTATIN 40 MG TABLET: 40 | 90 days supply | Qty: 90 | Fill #0

## 2020-01-22 MED FILL — VENLAFAXINE HCL ER 150 MG C: 150 | 90 days supply | Qty: 90 | Fill #0

## 2020-01-22 MED FILL — ZOLPIDEM TARTRATE 10 MG TAB: 10 | 90 days supply | Qty: 90 | Fill #1

## 2020-02-19 MED FILL — LOSARTAN POTASSIUM 100 MG T: 100 | 90 days supply | Qty: 90 | Fill #1

## 2020-03-03 MED FILL — TESTOSTERONE 30 MG/ACT SOLN: 30 | 90 days supply | Qty: 270 | Fill #1

## 2020-03-18 MED FILL — FAMOTIDINE 20 MG TABS: 20 | 90 days supply | Qty: 180 | Fill #1

## 2020-03-18 MED FILL — ATORVASTATIN CALCIUM 40 MG: 40 | 90 days supply | Qty: 90 | Fill #1

## 2020-04-07 MED FILL — VASCEPA 1 GM CAPSULE: 1 | 90 days supply | Qty: 360 | Fill #1

## 2020-04-15 MED FILL — ZOLPIDEM TARTRATE 10 MG TAB: 10 | 90 days supply | Qty: 90 | Fill #0

## 2020-04-20 ENCOUNTER — Other Ambulatory Visit (HOSPITAL_BASED_OUTPATIENT_CLINIC_OR_DEPARTMENT_OTHER): Payer: Self-pay | Admitting: Family Medicine

## 2020-04-20 MED FILL — VENLAFAXINE HCL ER 150 MG C: 150 | 90 days supply | Qty: 90 | Fill #0

## 2020-05-21 MED FILL — LOSARTAN POTASSIUM 100 MG T: 100 | 90 days supply | Qty: 90 | Fill #0

## 2020-05-24 IMAGING — DX DG CHEST 1V PORT
1 series · 1 of 1 positions shown · non-contrast
Comparison: 05/30/2017

CLINICAL DATA: Respiratory difficulty

EXAM:
PORTABLE CHEST 1 VIEW

[chest ap]
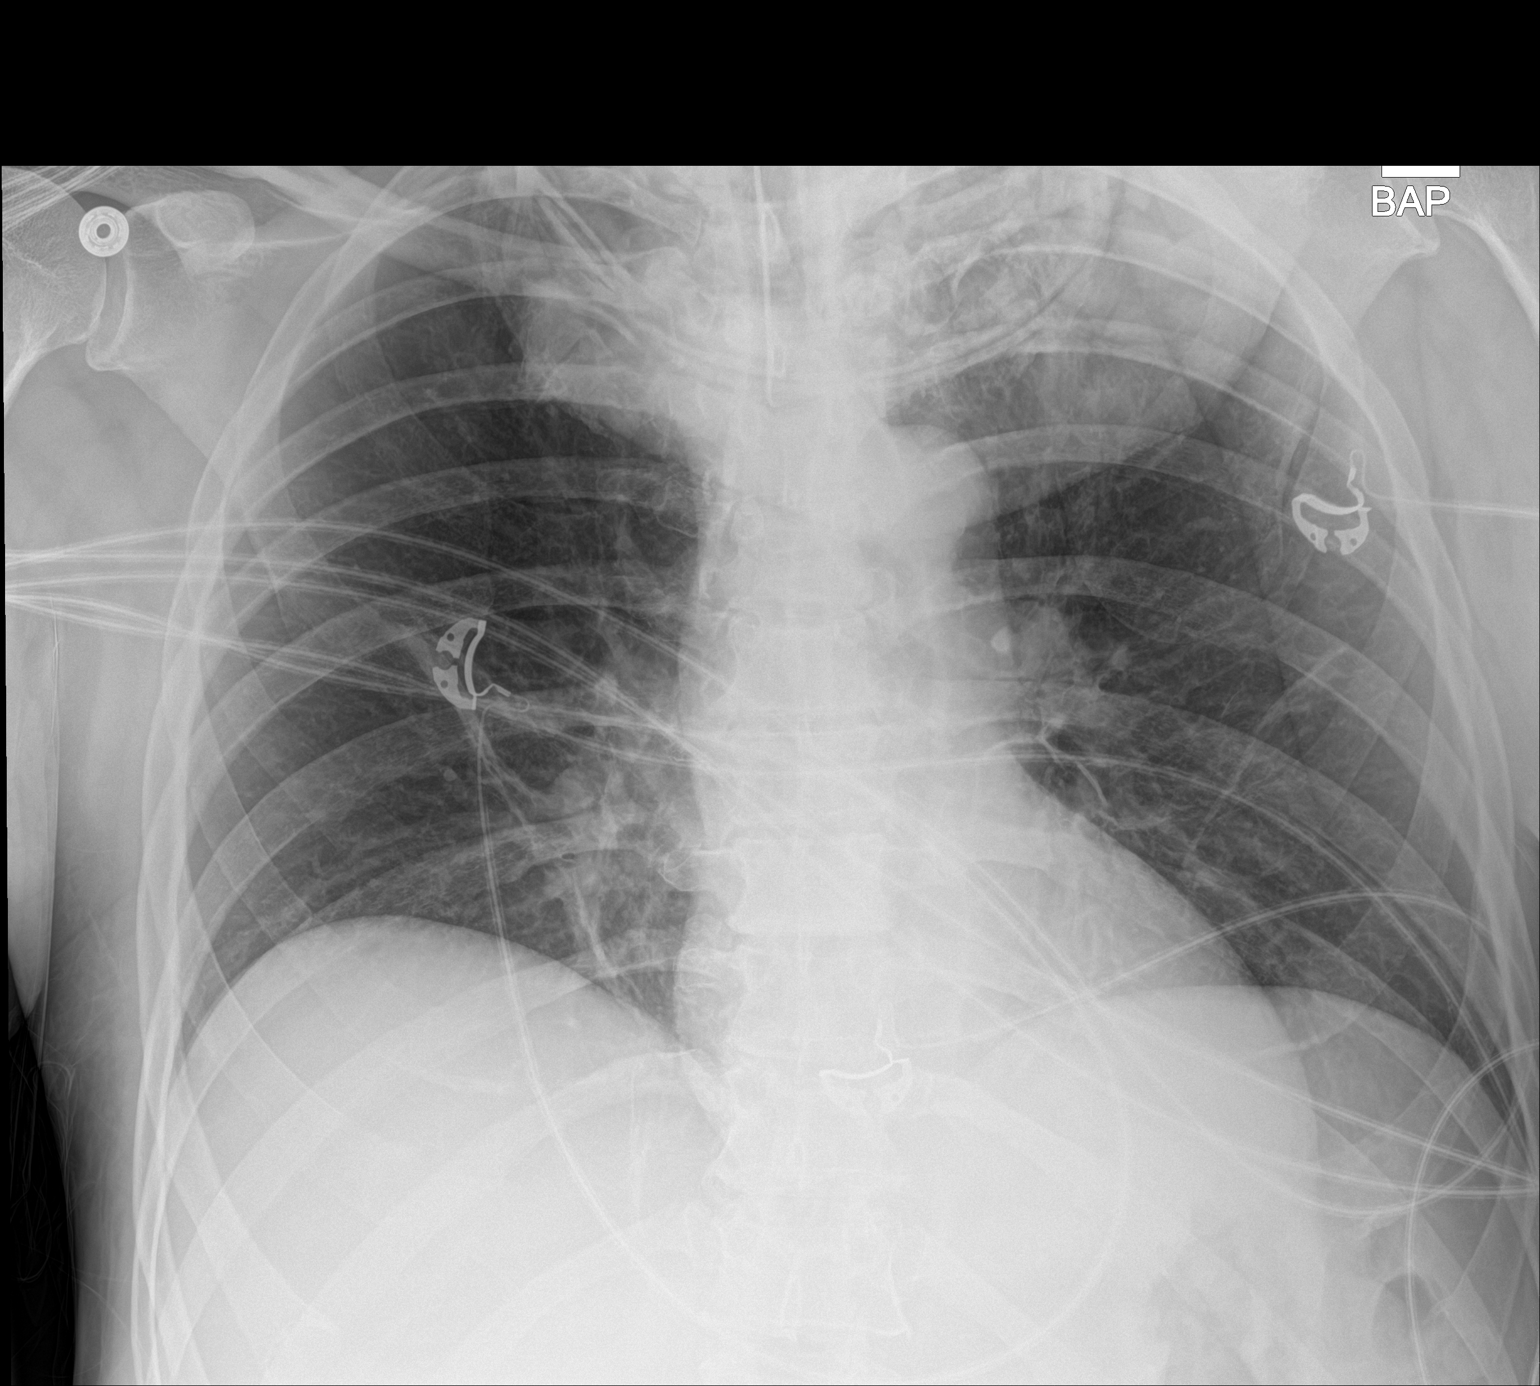

[1 of 1 positions shown; findings below may reference images not displayed]

FINDINGS: Endotracheal tube tip is 4.4 cm from the carina. Lung apices are
obscured by the patient's head and neck. Subsegmental atelectasis at
the lung bases. Normal heart size.
IMPRESSION: Limited exam.  Bibasilar atelectasis.  Endotracheal tube placed.

## 2020-05-25 IMAGING — RF DG C-ARM 61-120 MIN
1 series · 4 of 4 positions shown · non-contrast
Comparison: 06/06/2018

CLINICAL DATA: C3-C7 posterior fusion.

EXAM:
CERVICAL SPINE - 2-3 VIEW; DG C-ARM 61-120 MIN

[Series 1: run · 4 of 4 slices shown]
[im 1/4]
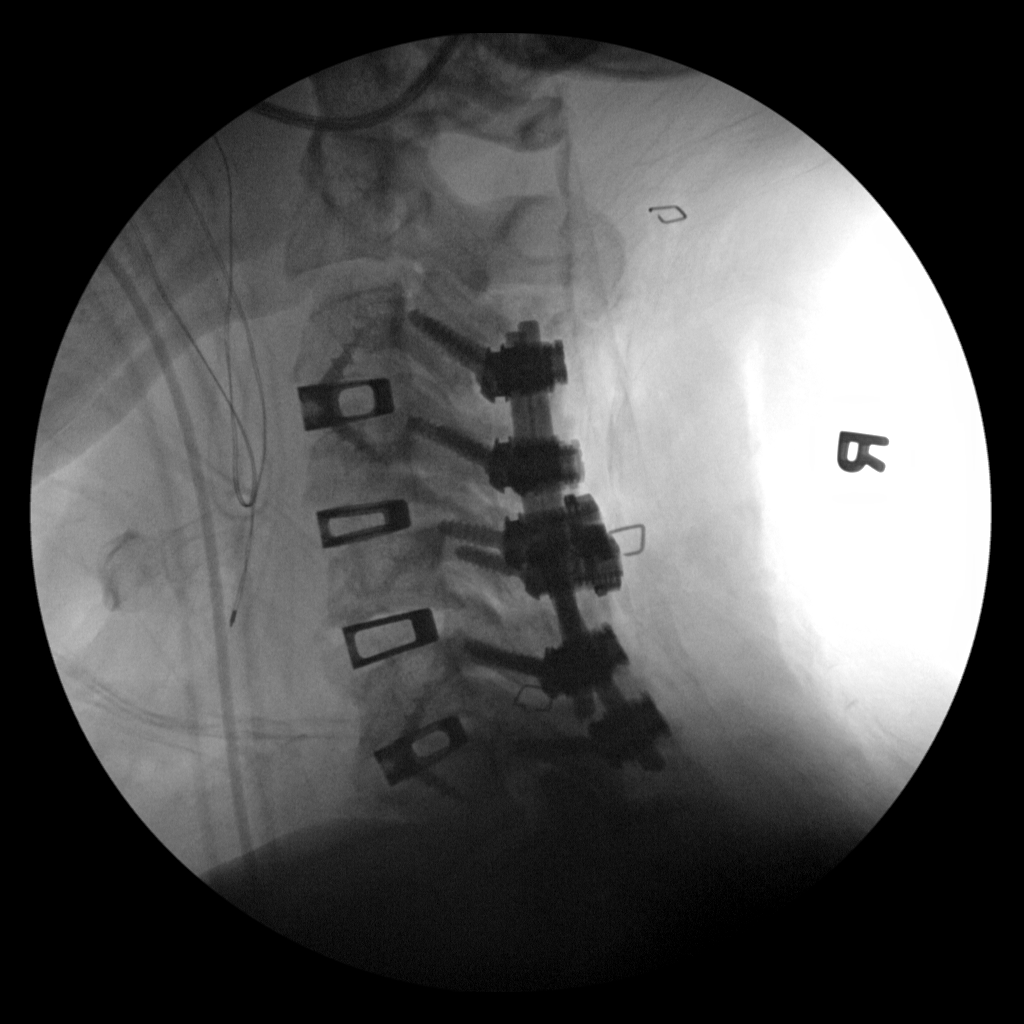
[im 2/4]
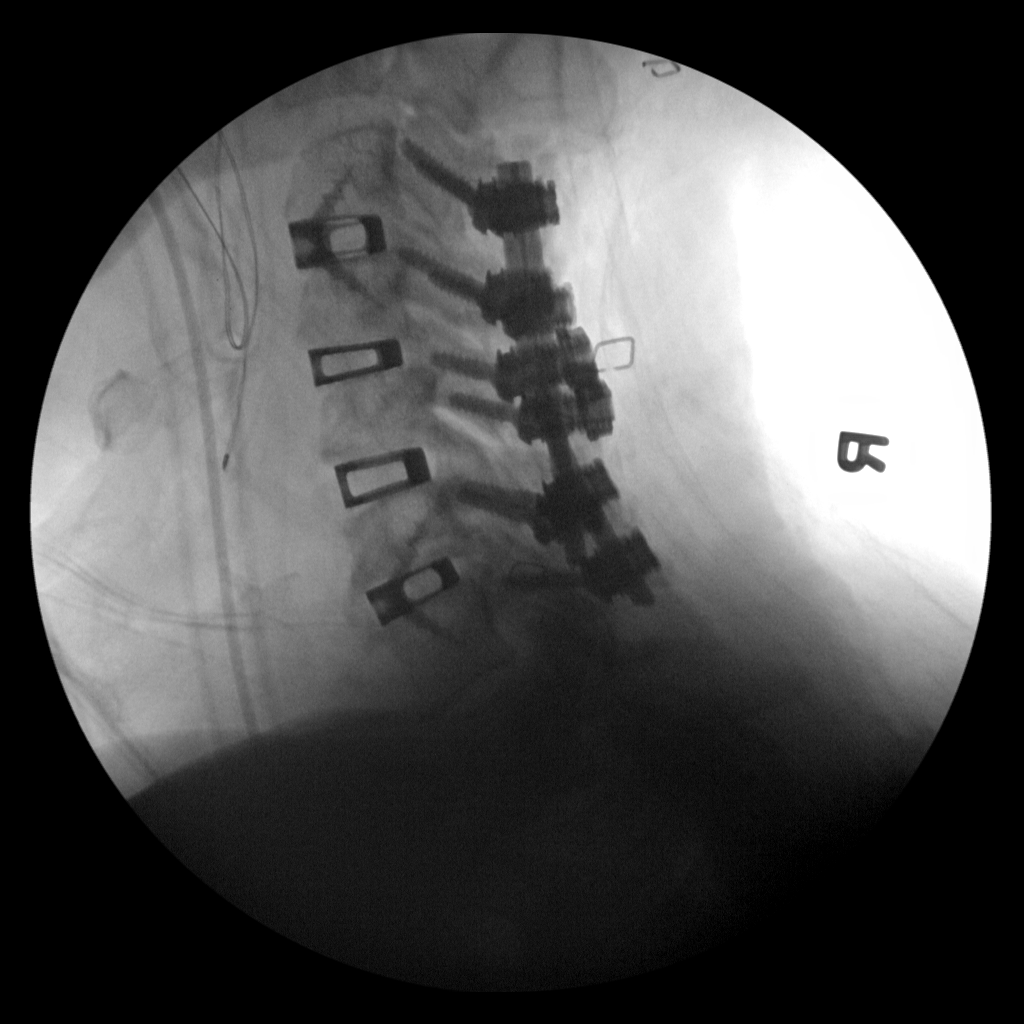
[im 3/4]
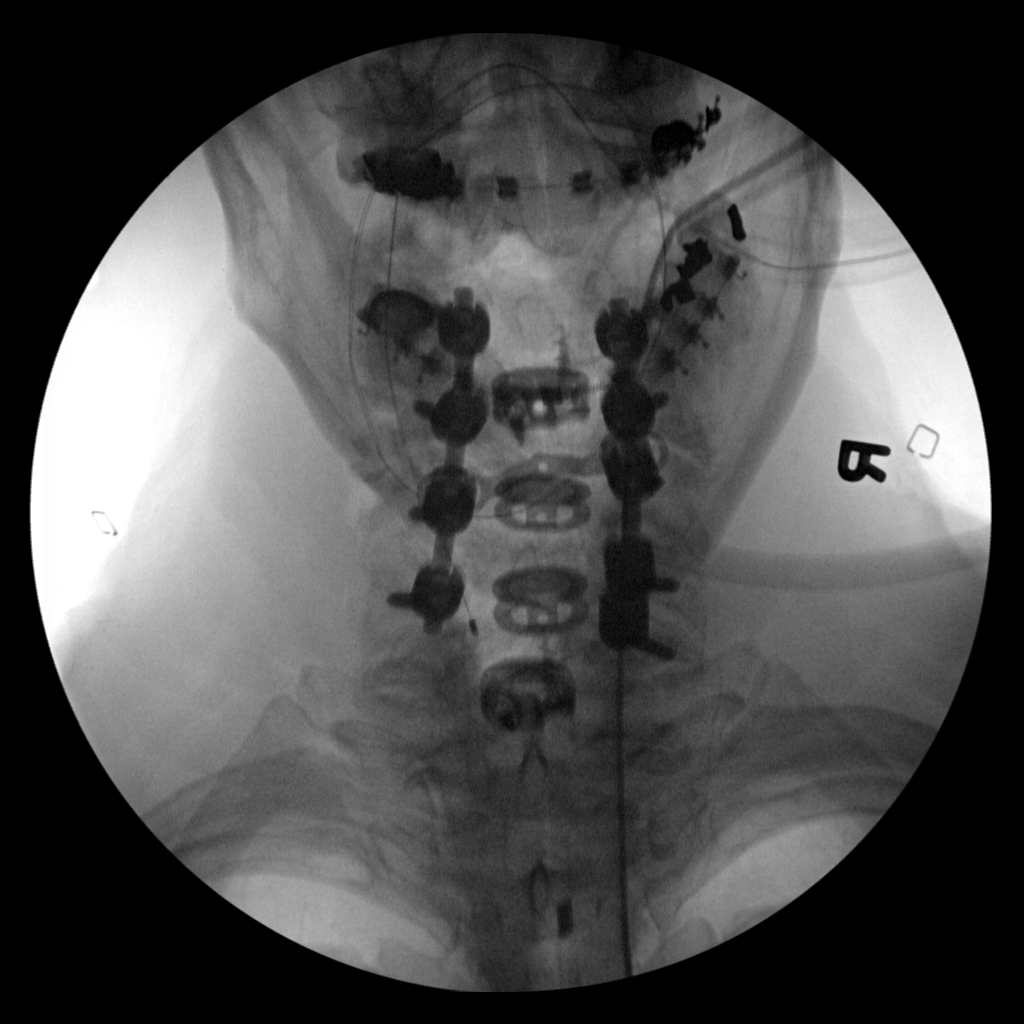
[im 4/4]
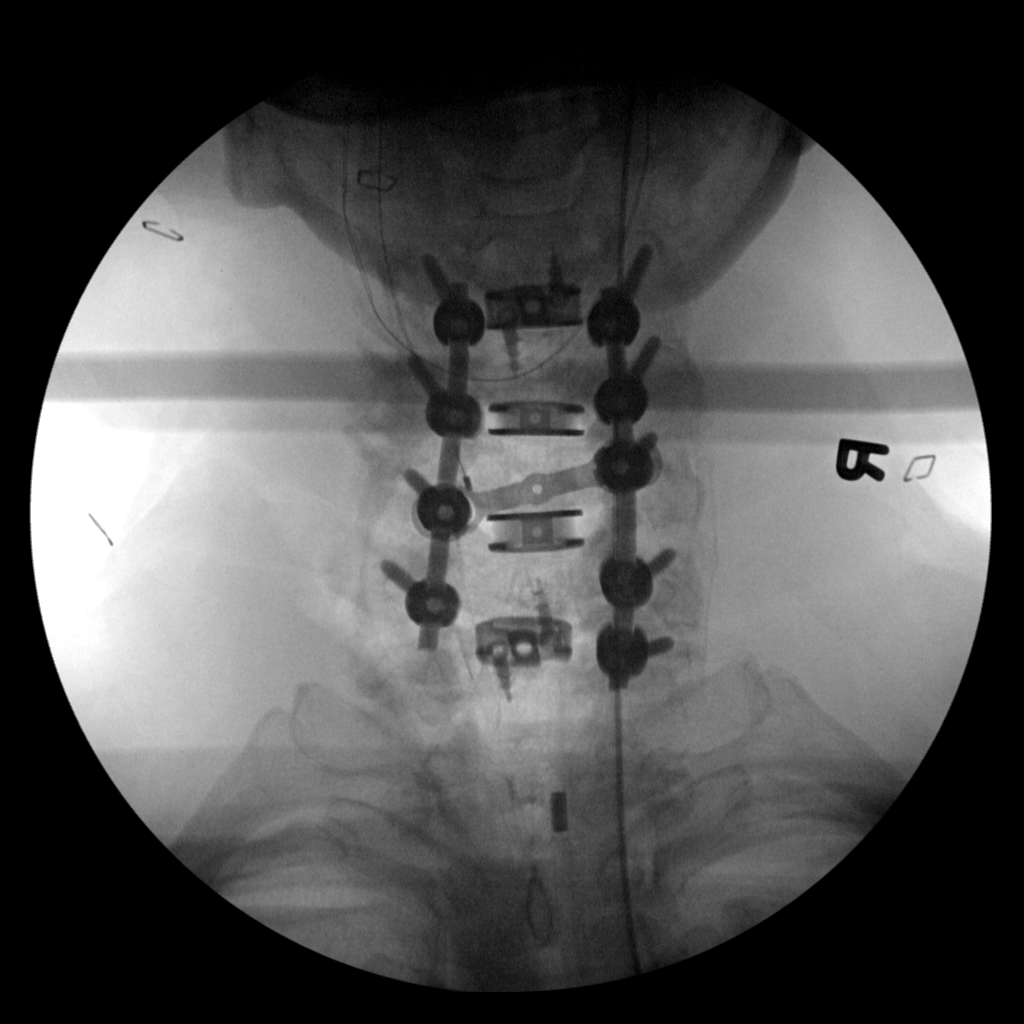

[4 of 4 positions shown; findings below may reference images not displayed]

FINDINGS: Intraoperative views of the cervical spine are submitted
postoperatively for interpretation.

Interval placement of bilateral posterior rod and bipedicular screws
at C3-C4-C5-C6-C7 noted.

Interbody fusion cages are again noted at C3-4, C4-5, C5-6 and C6-7.

No gross complicating features are identified.
IMPRESSION: Posterior fusion hardware from C3-C7.

## 2020-06-02 MED FILL — traMADol HCL 50 MG TABS: 50 | 20 days supply | Qty: 20 | Fill #0

## 2020-06-24 MED FILL — VASCEPA 1 GM CAPSULE: 1 | 90 days supply | Qty: 360 | Fill #0

## 2020-06-24 MED FILL — FAMOTIDINE 20 MG TABS: 20 | 90 days supply | Qty: 180 | Fill #0

## 2020-06-24 MED FILL — ATORVASTATIN 40 MG TABLET: 40 | 90 days supply | Qty: 90 | Fill #0

## 2020-07-14 MED FILL — ZOLPIDEM TARTRATE 10 MG TAB: 10 | 90 days supply | Qty: 90 | Fill #1

## 2020-07-20 MED FILL — VENLAFAXINE HCL ER 150 MG C: 150 | 90 days supply | Qty: 90 | Fill #1

## 2020-07-22 MED FILL — TESTOSTERONE 30 MG/ACT SOLN: 30 | 30 days supply | Qty: 90 | Fill #0

## 2020-08-06 ENCOUNTER — Other Ambulatory Visit (HOSPITAL_BASED_OUTPATIENT_CLINIC_OR_DEPARTMENT_OTHER): Payer: Self-pay | Admitting: Physician Assistant

## 2020-08-06 MED FILL — MOXIFLOXACIN HCL 0.5 % SOLN: 0.5 | 5 days supply | Qty: 6 | Fill #0

## 2020-08-06 MED FILL — POLYMYXIN B/TMP EYE DROPS: 10000-0.1 | 34 days supply | Qty: 10 | Fill #0

## 2020-08-06 MED FILL — DOXYCYCLINE HYCLATE 100 MG: 100 | 14 days supply | Qty: 14 | Fill #0

## 2020-08-12 ENCOUNTER — Other Ambulatory Visit (HOSPITAL_BASED_OUTPATIENT_CLINIC_OR_DEPARTMENT_OTHER): Payer: Self-pay

## 2020-08-12 MED FILL — MOXIFLOXACIN HCL 0.5 % SOLN: 0.5 | 10 days supply | Qty: 3 | Fill #0

## 2020-08-24 MED FILL — LOSARTAN POTASSIUM 100 MG T: 100 | 90 days supply | Qty: 90 | Fill #1

## 2020-09-04 ENCOUNTER — Other Ambulatory Visit (HOSPITAL_BASED_OUTPATIENT_CLINIC_OR_DEPARTMENT_OTHER): Payer: Self-pay | Admitting: Ophthalmology

## 2020-09-04 MED FILL — BROMFENAC SODIUM (ONCE-DAIL: 0.09 | 51 days supply | Qty: 5 | Fill #0

## 2020-09-17 ENCOUNTER — Other Ambulatory Visit (HOSPITAL_BASED_OUTPATIENT_CLINIC_OR_DEPARTMENT_OTHER): Payer: Self-pay

## 2020-09-17 MED FILL — TOBRAMYCIN 0.3 % SOLN: 0.3 | 8 days supply | Qty: 5 | Fill #0

## 2020-09-24 MED FILL — FAMOTIDINE 20 MG TABS: 20 | 90 days supply | Qty: 180 | Fill #1

## 2020-09-24 MED FILL — ATORVASTATIN CALCIUM 40 MG: 40 | 90 days supply | Qty: 90 | Fill #1

## 2020-09-24 MED FILL — VASCEPA 1 GM CAPSULE: 1 | 90 days supply | Qty: 360 | Fill #1

## 2020-09-28 ENCOUNTER — Other Ambulatory Visit (HOSPITAL_BASED_OUTPATIENT_CLINIC_OR_DEPARTMENT_OTHER): Payer: Self-pay | Admitting: Physician Assistant

## 2020-09-29 ENCOUNTER — Other Ambulatory Visit (HOSPITAL_BASED_OUTPATIENT_CLINIC_OR_DEPARTMENT_OTHER): Payer: Self-pay | Admitting: Family Medicine

## 2020-09-29 MED FILL — TESTOSTERONE 30 MG/ACT SOLN: 30 | 30 days supply | Qty: 90 | Fill #0

## 2020-10-09 MED FILL — ZOLPIDEM TARTRATE 10 MG TAB: 10 | 30 days supply | Qty: 30 | Fill #0

## 2021-07-27 ENCOUNTER — Other Ambulatory Visit: Payer: Self-pay

## 2021-07-27 ENCOUNTER — Ambulatory Visit: Payer: 59 | Admitting: Physical Therapy

## 2021-07-27 ENCOUNTER — Encounter: Payer: Self-pay | Admitting: Physical Therapy

## 2021-07-27 ENCOUNTER — Ambulatory Visit: Payer: 59 | Attending: Family Medicine

## 2021-07-27 DIAGNOSIS — M6281 Muscle weakness (generalized): Secondary | ICD-10-CM | POA: Diagnosis present

## 2021-07-27 DIAGNOSIS — R2681 Unsteadiness on feet: Secondary | ICD-10-CM

## 2021-07-27 DIAGNOSIS — R4701 Aphasia: Secondary | ICD-10-CM | POA: Insufficient documentation

## 2021-07-27 DIAGNOSIS — R2689 Other abnormalities of gait and mobility: Secondary | ICD-10-CM

## 2021-07-27 DIAGNOSIS — R41841 Cognitive communication deficit: Secondary | ICD-10-CM | POA: Diagnosis not present

## 2021-07-27 NOTE — Therapy (Addendum)
[facility] [address] [place], [place], [address] Phone: [phone]   Fax:  [phone]  Physical Therapy Evaluation  Patient Details  Name: Brett Humphrey MRN: [id number] Date of Birth: 13-Oct-1960 Referring Provider (PT): Carilyn Goodpasture, NP   Encounter Date: 07/27/2021   PT End of Session - 07/27/21 1523     Visit Number 1    Number of Visits 17    Date for PT Re-Evaluation 09/24/21    Authorization Type [employer]    PT Start Time 1406    PT Stop Time 1454    PT Time Calculation (min) 48 min    Equipment Utilized During Treatment Gait belt    Activity Tolerance Patient tolerated treatment well    Behavior During Therapy WFL for tasks assessed/performed             Past Medical History:  Diagnosis Date   Arthritis    back   ED (erectile dysfunction)    Esophageal reflux    GERD (gastroesophageal reflux disease)    Hypercholesteremia    Hyperlipidemia    Hypertension    Male hypogonadism    OSA (obstructive sleep apnea)    Severe w AHI 37/hr now on CPAP at 14cm H2O   Panic disorder without agoraphobia     Past Surgical History:  Procedure Laterality Date   ANTERIOR CERVICAL DECOMPRESSION/DISCECTOMY FUSION 4 LEVELS N/A 06/06/2018   Procedure: ANTERIOR CERVICAL DECOMPRESSION/DISCECTOMY FUSION C3-7;  Surgeon: Venita Lick, MD;  Location: [facility];  Service: Orthopedics;  Laterality: N/A;  5 hrs   BACK SURGERY  06/12/2017   lumbar L4-5   COLONOSCOPY     EYE SURGERY     bilateral cataract with lens implants   HERNIA REPAIR     right inguinal   INGUINAL HERNIA REPAIR Right 11/17/2017   Procedure: OPEN RIGHT HERNIA REPAIR INGUINAL ADULT;  Surgeon: Axel Filler, MD;  Location: [facility];  Service: General;  Laterality: Right;   INSERTION OF MESH Right 11/17/2017   Procedure: INSERTION OF MESH;  Surgeon: Axel Filler, MD;  Location[facility];  Service: General;  Laterality: Right;   POSTERIOR CERVICAL  FUSION/FORAMINOTOMY N/A 06/07/2018   Procedure: Posterior spinal fusion interbody C3-7;  Surgeon: Venita Lick, MD;  [facility];  Service: Orthopedics;  Laterality: N/A;  4 hrs   RHINOPLASTY     20 years ago   ROTATOR CUFF REPAIR     right shoulder Dr Magnus Ivan    There were no vitals filed for this visit.    Subjective Assessment - 07/27/21 1414     Subjective Pt reports having stroke 07/16/21, with L sided weakness.  Feel like it was pretty much loss of my L side.  My left leg was affected originally after lumbar surgery several years ago and that is the side affected by the stroke.  Sometimes L side feels like it might give way.  No falls.    Pertinent History PMH:  HTN, hyperlipidemia, OSA, GERD, arthritis    Patient Stated Goals Pt's goals for therapy are to improve balance, Left leg strength    Currently in Pain? Yes    Pain Score 3     Pain Location Leg    Pain Orientation Left    Pain Descriptors / Indicators Sore    Pain Type Acute pain    Pain Onset In the past 7 days    Pain Frequency Constant    Aggravating Factors  feels  like limping has aggravated    Pain Relieving Factors rest    Effect of Pain on Daily Activities PT will monitor pain and attempt to address through exercises                [facility] PT Assessment - 07/27/21 1420       Assessment   Medical Diagnosis CVA    Referring Provider (PT) Carilyn Goodpasture, NP    Onset Date/Surgical Date 07/16/21    Hand Dominance Right      Precautions   Precautions Fall    Precaution Comments Not yet driving      Balance Screen   Has the patient fallen in the past 6 months No    Has the patient had a decrease in activity level because of a fear of falling?  No    Is the patient reluctant to leave their home because of a fear of falling?  No      Home Environment   Living Environment Private residence    Living Arrangements Spouse/significant other   wife   Available Help at Discharge Family    Type of Home  House    Home Access Stairs to enter    Entrance Stairs-Number of Steps 3    Entrance Stairs-Rails None    Home Layout One level    Home Equipment None      Prior Function   Level of Independence Independent    Vocation Full time employment    Pharmacologist inspector-walking, steps, ladders, maneuver tight spaces    Leisure Enjoys working in garage, yardwork      Observation/Other Assessments   Focus on Therapeutic Outcomes (FOTO)  NA-not set up yet      Sensation   Light Touch Appears Intact;Impaired by gross assessment    Proprioception Appears Intact    Additional Comments Pt reports slight increased numbness LLE      Tone   Assessment Location Left Lower Extremity;Right Lower Extremity      ROM / Strength   AROM / PROM / Strength AROM;Strength      AROM   Overall AROM  Within functional limits for tasks performed      Strength   Overall Strength Within functional limits for tasks performed    Overall Strength Comments Ataxic movements LLE with resistance in quads and hamstrings.      Transfers   Transfers Sit to Stand;Stand to Sit    Sit to Stand 6: Modified independent (Device/Increase time);Without upper extremity assist;From chair/3-in-1    Five time sit to stand comments  13.34    Stand to Sit 6: Modified independent (Device/Increase time);Without upper extremity assist;To chair/3-in-1      Ambulation/Gait   Ambulation/Gait Yes    Ambulation/Gait Assistance 5: Supervision    Ambulation Distance (Feet) 50 Feet   x 2   Assistive device None    Gait Pattern Step-through pattern;Decreased arm swing - left;Decreased step length - left;Decreased dorsiflexion - left;Left foot flat;Left flexed knee in stance;Poor foot clearance - left    Ambulation Surface Level;Indoor    Gait velocity 12.88 sec = 2.55 ft/sec      Standardized Balance Assessment   Standardized Balance Assessment Timed Up and Go Test      Timed Up and Go Test   Normal TUG (seconds)  14.19    TUG Comments Scores >13.5 sec indicate increased fall risk      Functional Gait  Assessment   Gait assessed  Yes  Gait Level Surface Walks 20 ft, slow speed, abnormal gait pattern, evidence for imbalance or deviates 10-15 in outside of the 12 in walkway width. Requires more than 7 sec to ambulate 20 ft.    Change in Gait Speed Makes only minor adjustments to walking speed, or accomplishes a change in speed with significant gait deviations, deviates 10-15 in outside the 12 in walkway width, or changes speed but loses balance but is able to recover and continue walking.    Gait with Horizontal Head Turns Performs head turns smoothly with slight change in gait velocity (eg, minor disruption to smooth gait path), deviates 6-10 in outside 12 in walkway width, or uses an assistive device.    Gait with Vertical Head Turns Performs task with slight change in gait velocity (eg, minor disruption to smooth gait path), deviates 6 - 10 in outside 12 in walkway width or uses assistive device    Gait and Pivot Turn Pivot turns safely within 3 sec and stops quickly with no loss of balance.    Step Over Obstacle Is able to step over one shoe box (4.5 in total height) but must slow down and adjust steps to clear box safely. May require verbal cueing.    Gait with Narrow Base of Support Is able to ambulate for 10 steps heel to toe with no staggering.    Gait with Eyes Closed Cannot walk 20 ft without assistance, severe gait deviations or imbalance, deviates greater than 15 in outside 12 in walkway width or will not attempt task.   veers to L   Ambulating Backwards Walks 20 ft, uses assistive device, slower speed, mild gait deviations, deviates 6-10 in outside 12 in walkway width.   18.13 sec   Steps Two feet to a stair, must use rail.    Total Score 16    FGA comment: Scores <22/30 indicates increased fall risk.      RLE Tone   RLE Tone Within Functional Limits      LLE Tone   LLE Tone Mild                         Objective measurements completed on examination: See above findings.          Therapeutic Exercise   Access Code: [email or web address] URL: [email or web address] Date: 07/27/2021 Prepared by: Lonia Blood  Exercises Sit to stand in stride stance - 1-2 x daily - 5 x weekly - 2 sets - 10 reps Seated Ankle Dorsiflexion with Resistance - 1-2 x daily - 5 x weekly - 2-3 sets - 10 reps      PT Education - 07/27/21 1522     Education Details Eval results, POC; initiated HEP-see instructions    Person(s) Educated Patient    Methods Explanation;Demonstration;Handout;Verbal cues    Comprehension Verbalized understanding;Returned demonstration              PT Short Term Goals - 07/27/21 1535       PT SHORT TERM GOAL #1   Title Pt will be independent with HEP for improved strength, balance, transfers, and gait.  TARGET 08/27/2021    Time 4    Period Weeks    Status New      PT SHORT TERM GOAL #2   Title Pt will improve 5x sit<>stand to less than or equal to 11.5 sec to demonstrate improved functional strength and transfer efficiency.    Baseline 13.34 sec    Time 4  Period Weeks    Status New      PT SHORT TERM GOAL #3   Title Pt will improve TUG score to less than or equal to 13.5 sec for decreased fall risk.    Baseline 14.19 sec    Time 4    Period Weeks    Status New      PT SHORT TERM GOAL #4   Title Pt will improve FGA score to at least 19/30 to decrease fall risk.    Baseline 16/30    Time 4    Period Weeks    Status New      PT SHORT TERM GOAL #5   Title Pt will verbalize understanding of fall prevention in home environment.    Time 4    Period Weeks    Status New               PT Long Term Goals - 07/27/21 1539       PT LONG TERM GOAL #1   Title Pt will be independent with progression of HEP for improved strength, balance, transfers, and gait.  TARGET 09/24/2021    Time 8    Period Weeks    Status  New      PT LONG TERM GOAL #2   Title Pt will improve gait velocity to at least 2.62 ft/sec for improved gait efficiency and safety.    Baseline 2.55 ft/sec    Time 8    Period Weeks    Status New      PT LONG TERM GOAL #3   Title Pt will improve FGA score to at least 22/30 to decrease fall risk.    Time 8    Period Weeks    Status New                    Plan - 07/27/21 1527     Clinical Impression Statement Pt is a 60 yo male who presented to ED 07/16/21 with reports sudden onset left-sided weakness with left UE numbness, dysarthria and noticeable left facial droop.  MRI shows moderate sized acute/recent infarct seen throughout the right anterior lateral temporal lobe cortex.  He will have cardiology follow-up to rule out A-fib.  He presents to OPPT with L sided weakness, decreased functional strength, decreased sensation, decreased balance, decreased timing and coordination of gait.  Prior to CVA, he was independent and working as Database administrator.  He would benefit from skilled PT to address the above stated deficits to decrease fall risk and improve functional mobility and independence.    Personal Factors and Comorbidities Comorbidity 3+    Comorbidities PMH:  HTN, hyperlipidemia, OSA, GERD, arthritis    Examination-Activity Limitations Locomotion Level;Transfers;Stairs;Stand    Examination-Participation Restrictions Occupation;Community Activity;Driving;Yard Work    Conservation officer, historic buildings Evolving/Moderate complexity    Clinical Decision Making Moderate    Rehab Potential Good    PT Frequency 2x / week   Recommended; pt requests to schedule 1x/wk to start   PT Duration 8 weeks   plus eval   PT Treatment/Interventions ADLs/Self Care Home Management;DME Instruction;Neuromuscular re-education;Balance training;Therapeutic exercise;Therapeutic activities;Functional mobility training;Stair training;Gait training;Patient/family education;Manual techniques    PT  Next Visit Plan Review initial HEP, progress HEP for LLE strength and for balance; work on step ups, corner balance exercises solid and compliant surfaces    Consulted and Agree with Plan of Care Patient             Patient will  benefit from skilled therapeutic intervention in order to improve the following deficits and impairments:  Abnormal gait, Difficulty walking, Decreased balance, Decreased strength, Decreased mobility, Impaired sensation  Visit Diagnosis: Other abnormalities of gait and mobility  Unsteadiness on feet  Muscle weakness (generalized)     Problem List Patient Active Problem List   Diagnosis Date Noted   Elective surgery    DDD (degenerative disc disease), cervical 06/06/2018   S/P cervical spinal fusion 06/06/2018   Impaired spontaneous ventilation    Endotracheally intubated    Hyperlipidemia    Hypercholesteremia    GERD (gastroesophageal reflux disease)    Panic disorder without agoraphobia    Male hypogonadism    OSA (obstructive sleep apnea)     Ludy Messamore W., PT 07/27/2021, 3:42 PM  [facility] [address] [place], [place], [address] Phone: [phone]   Fax:  [phone]  Name: RIVAAN KENDALL MRN: [id number] Date of Birth: December 07, 1960

## 2021-07-27 NOTE — Patient Instructions (Signed)
Access Code: [email or web address] URL: [email or web address] Date: 07/27/2021 Prepared by: Lonia Blood  Exercises Sit to stand in stride stance - 1-2 x daily - 5 x weekly - 2 sets - 10 reps Seated Ankle Dorsiflexion with Resistance - 1-2 x daily - 5 x weekly - 2-3 sets - 10 reps

## 2021-07-28 NOTE — Therapy (Signed)
[facility] [address], [place], [place], [address] Phone: [phone]   Fax:  [phone]  Speech Language Pathology Evaluation  Patient Details  Name: Brett Humphrey MRN: [id number] Date of Birth: 1961/08/02 Referring Provider (SLP): Almedia Balls, NP   Encounter Date: 07/27/2021   End of Session - 07/28/21 1431     Visit Number 1    Number of Visits 17    Date for SLP Re-Evaluation 09/25/21    SLP Start Time 64    SLP Stop Time  1400    SLP Time Calculation (min) 42 min    Activity Tolerance Patient tolerated treatment well             Past Medical History:  Diagnosis Date   Arthritis    back   ED (erectile dysfunction)    Esophageal reflux    GERD (gastroesophageal reflux disease)    Hypercholesteremia    Hyperlipidemia    Hypertension    Male hypogonadism    OSA (obstructive sleep apnea)    Severe w AHI 37/hr now on CPAP at 14cm H2O   Panic disorder without agoraphobia     Past Surgical History:  Procedure Laterality Date   ANTERIOR CERVICAL DECOMPRESSION/DISCECTOMY FUSION 4 LEVELS N/A 06/06/2018   Procedure: ANTERIOR CERVICAL DECOMPRESSION/DISCECTOMY FUSION C3-7;  Surgeon: Melina Schools, MD;  Location: [place];  Service: Orthopedics;  Laterality: N/A;  5 hrs   BACK SURGERY  06/12/2017   lumbar L4-5   COLONOSCOPY     EYE SURGERY     bilateral cataract with lens implants   HERNIA REPAIR     right inguinal   INGUINAL HERNIA REPAIR Right 11/17/2017   Procedure: OPEN RIGHT HERNIA REPAIR INGUINAL ADULT;  Surgeon: Ralene Ok, MD;  Location: [facility];  Service: General;  Laterality: Right;   INSERTION OF MESH Right 11/17/2017   Procedure: INSERTION OF MESH;  Surgeon: Ralene Ok, MD;  Location: [facility];  Service: General;  Laterality: Right;   POSTERIOR CERVICAL FUSION/FORAMINOTOMY N/A 06/07/2018   Procedure: Posterior spinal fusion interbody C3-7;  Surgeon: Melina Schools, MD;  Location: [facility];  Service:  Orthopedics;  Laterality: N/A;  4 hrs   RHINOPLASTY     20 years ago   ROTATOR CUFF REPAIR     right shoulder Dr Ninfa Linden    There were no vitals filed for this visit.   Subjective Assessment - 07/28/21 1412     Subjective "I really want to take care of this atiral fib. It makes me feel really tired. I have the cardiology appointment next Wednesday."    Currently in Pain? Yes    Pain Score 3     Pain Location Leg    Pain Orientation Left    Pain Descriptors / Indicators Sore    Pain Type Acute pain    Pain Onset In the past 7 days                SLP Evaluation [facility] - 07/28/21 1412       SLP Visit Information   SLP Received On 07/27/21    Referring Provider (SLP) Almedia Balls, NP    Onset Date 07-16-21    Medical Diagnosis rt CVA      Subjective   Patient/Family Stated Goal "I'd love to get back to work."      General Information   HPI Pt      Prior Functional Status   Cognitive/Linguistic Baseline Within functional  limits    Type of Home House     Lives With Spouse    Education Certification post high-school    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 Pt provided examples of forgetting his wife had ordered but not rec'd her new phone, and forgetting details of conversations with his wife and his daughter. Brett Humphrey is taking his medication and tracking appointments independently and successfully.    Awareness Comments --      Auditory Comprehension   Overall Auditory Comprehension Appears within functional limits for tasks assessed      Verbal Expression   Overall Verbal Expression Impaired    Level of Generative/Spontaneous Verbalization Conversation    Other Verbal Expression Comments Pt  demonstrated some mild hesitation and pausing during conversation regarding medical history, his job, and his time in the hospital (mod complex conversation). Brett Humphrey stated "choosing his words" was difficult for him at times. Pt is less than two weeks from the date of his CVA and SLP is of the opinion that this should resolve during the next 2-4 weeks. SLP to monitor pt's abilities in this area; will add goal for WNL conversational fluency due to [facility] word finding in conversation.      Oral Motor/Sensory Function   Overall Oral Motor/Sensory Function Appears within functional limits for tasks assessed      Motor Speech   Overall Motor Speech Appears within functional limits for tasks assessed   pt reports his speech clarity/intelligibility has improved and is again at baseline                            SLP Education - 07/28/21 1430     Education Details deficit areas, need for more in-depth cognitive assessment, possible goals, SLP recommends ST x2/week for 4-8 weeks    Person(s) Educated Patient    Methods Explanation    Comprehension Verbalized understanding;Need further instruction              SLP Short Term Goals - 07/28/21 1538       SLP SHORT TERM GOAL #1   Title pt will demonstrate awareness of errors in mod complex written and spoken language tasks in 3 sessions    Time 4    Period Weeks    Status New    Target Date 08/25/21      SLP SHORT TERM GOAL #2   Title pt will complete formal cognitive linguisitic testing    Time 2    Period Weeks    Status New    Target Date 08/13/21      SLP SHORT TERM GOAL #3   Title pt will demo WNL language fluency, and will undergo formal language assessment if goal is not met    Time 4    Period Weeks    Status New    Target Date 08/25/21              SLP Long Term Goals - 07/28/21 1545       SLP LONG TERM GOAL #1   Title pt will demo ability to perform two min complex language tasks simultaneously in  3 sessions    Time 8  Period Weeks    Target Date 09/25/21              Plan - 07/28/21 1432     Clinical Impression Statement Today, Brett Humphrey "[name]" presents with mild expressive (verbal) deficits observed in mod complex conversation, characterized by short pauses and hesitations in his speech fluency. Additionally pt scored in the mild cognitive impaired range (25/30, scores </= 26 are outside WNL) on the SLUMS, with errors mostly with memory but also with attention (attention in detailed work) with clock drawing task - pt put his hour markers outside of the clock face instead of writing his hour markers on the clock face itself. SLP will need to administer more in-depth cognitive testing to ascertain more complete understanding of his mild cognitive deficits. He would benefit from skilled ST to target mild cognitive deficits and to monitor verbal expression ability. If expressive language does not resolve in the next ~2 weeks it may be necessary to formally assess pt's language skills. Notably pt appeared reticent to schedule therapies (PT or ST) before he is able to get more definte understanding of his cardiologic status, and pt made known his concern about his atrial fib x4 during the 40 minute evaluation today.    Speech Therapy Frequency 2x / week    Duration 8 weeks    Treatment/Interventions Language facilitation;Compensatory techniques;Internal/external aids;SLP instruction and feedback;Cognitive reorganization;Environmental controls;Functional tasks;Cueing hierarchy;Patient/family education    Potential to Achieve Goals Good             Patient will benefit from skilled therapeutic intervention in order to improve the following deficits and impairments:   Cognitive communication deficit    Problem List Patient Active Problem List   Diagnosis Date Noted   Elective surgery    DDD (degenerative disc disease), cervical 06/06/2018   S/P cervical spinal fusion 06/06/2018    Impaired spontaneous ventilation    Endotracheally intubated    Hyperlipidemia    Hypercholesteremia    GERD (gastroesophageal reflux disease)    Panic disorder without agoraphobia    Male hypogonadism    OSA (obstructive sleep apnea)     Brett Humphrey ,MS, CCC-SLP  07/28/2021, 3:52 PM  [facility] [address] [place], [place], [address] Phone: [phone]   Fax:  [phone]  Name: Brett Humphrey MRN: [id number] Date of Birth: Dec 22, 1960

## 2021-07-28 NOTE — Addendum Note (Signed)
Addended by: Verdie Mosher B on: 07/28/2021 04:25 PM   Modules accepted: Orders

## 2021-07-28 NOTE — Addendum Note (Signed)
Addended by: Verdie Mosher B on: 07/28/2021 03:54 PM   Modules accepted: Orders

## 2021-08-03 ENCOUNTER — Ambulatory Visit: Payer: Self-pay | Admitting: Physical Therapy

## 2021-10-08 ENCOUNTER — Encounter: Payer: Self-pay | Admitting: Physical Therapy

## 2021-10-08 NOTE — Therapy (Signed)
[facility] [place] [address] [place], [place], [address] Phone: [phone]   Fax:  [phone]  Patient Details  Name: Brett Humphrey MRN: [id number] Date of Birth: May 19, 1961 Referring Provider:  Almedia Balls, NP  Encounter Date: 10/08/2021  PHYSICAL THERAPY DISCHARGE SUMMARY  Visits from Start of Care: 1   Current functional level related to goals / functional outcomes: See eval, as pt did not return to therapy after PT eval.   Remaining deficits: See eval   Education / Equipment: Not addressed, as pt did not return to PT after eval.   Patient goals were not met. Patient is being discharged due to not returning since the last visit.   Lowanda Cashaw W., PT 10/08/2021, 9:49 AM  [facility] [address], [place], [place], [address] Phone: [phone]   Fax:  [phone]

## 2022-05-29 ENCOUNTER — Emergency Department (HOSPITAL_BASED_OUTPATIENT_CLINIC_OR_DEPARTMENT_OTHER): Payer: 59

## 2022-05-29 ENCOUNTER — Encounter (HOSPITAL_BASED_OUTPATIENT_CLINIC_OR_DEPARTMENT_OTHER): Payer: Self-pay | Admitting: Emergency Medicine

## 2022-05-29 ENCOUNTER — Other Ambulatory Visit: Payer: Self-pay

## 2022-05-29 ENCOUNTER — Inpatient Hospital Stay (HOSPITAL_BASED_OUTPATIENT_CLINIC_OR_DEPARTMENT_OTHER)
Admission: EM | Admit: 2022-05-29 | Discharge: 2022-05-31 | DRG: 193 | Disposition: A | Discharge status: Home / Self Care | Payer: 59 | Attending: Internal Medicine | Admitting: Internal Medicine

## 2022-05-29 ENCOUNTER — Encounter (HOSPITAL_COMMUNITY): Payer: Self-pay

## 2022-05-29 DIAGNOSIS — Z825 Family history of asthma and other chronic lower respiratory diseases: Secondary | ICD-10-CM | POA: Diagnosis not present

## 2022-05-29 DIAGNOSIS — Z8673 Personal history of transient ischemic attack (TIA), and cerebral infarction without residual deficits: Secondary | ICD-10-CM | POA: Diagnosis not present

## 2022-05-29 DIAGNOSIS — G43909 Migraine, unspecified, not intractable, without status migrainosus: Secondary | ICD-10-CM | POA: Diagnosis not present

## 2022-05-29 DIAGNOSIS — E785 Hyperlipidemia, unspecified: Secondary | ICD-10-CM | POA: Diagnosis not present

## 2022-05-29 DIAGNOSIS — G4733 Obstructive sleep apnea (adult) (pediatric): Secondary | ICD-10-CM | POA: Diagnosis present

## 2022-05-29 DIAGNOSIS — M5030 Other cervical disc degeneration, unspecified cervical region: Secondary | ICD-10-CM | POA: Diagnosis not present

## 2022-05-29 DIAGNOSIS — Z20822 Contact with and (suspected) exposure to covid-19: Secondary | ICD-10-CM | POA: Diagnosis not present

## 2022-05-29 DIAGNOSIS — N179 Acute kidney failure, unspecified: Secondary | ICD-10-CM | POA: Diagnosis present

## 2022-05-29 DIAGNOSIS — Z7901 Long term (current) use of anticoagulants: Secondary | ICD-10-CM | POA: Diagnosis not present

## 2022-05-29 DIAGNOSIS — Z981 Arthrodesis status: Secondary | ICD-10-CM | POA: Diagnosis not present

## 2022-05-29 DIAGNOSIS — K219 Gastro-esophageal reflux disease without esophagitis: Secondary | ICD-10-CM | POA: Diagnosis not present

## 2022-05-29 DIAGNOSIS — G4700 Insomnia, unspecified: Secondary | ICD-10-CM | POA: Diagnosis not present

## 2022-05-29 DIAGNOSIS — Z809 Family history of malignant neoplasm, unspecified: Secondary | ICD-10-CM

## 2022-05-29 DIAGNOSIS — E871 Hypo-osmolality and hyponatremia: Secondary | ICD-10-CM | POA: Diagnosis not present

## 2022-05-29 DIAGNOSIS — F410 Panic disorder [episodic paroxysmal anxiety] without agoraphobia: Secondary | ICD-10-CM | POA: Diagnosis not present

## 2022-05-29 DIAGNOSIS — I10 Essential (primary) hypertension: Secondary | ICD-10-CM | POA: Diagnosis present

## 2022-05-29 DIAGNOSIS — I480 Paroxysmal atrial fibrillation: Secondary | ICD-10-CM | POA: Diagnosis not present

## 2022-05-29 DIAGNOSIS — J9601 Acute respiratory failure with hypoxia: Secondary | ICD-10-CM | POA: Diagnosis not present

## 2022-05-29 DIAGNOSIS — Z79899 Other long term (current) drug therapy: Secondary | ICD-10-CM | POA: Diagnosis not present

## 2022-05-29 DIAGNOSIS — Z87891 Personal history of nicotine dependence: Secondary | ICD-10-CM | POA: Diagnosis not present

## 2022-05-29 DIAGNOSIS — J189 Pneumonia, unspecified organism: Secondary | ICD-10-CM | POA: Diagnosis not present

## 2022-05-29 DIAGNOSIS — Z8249 Family history of ischemic heart disease and other diseases of the circulatory system: Secondary | ICD-10-CM | POA: Diagnosis not present

## 2022-05-29 DIAGNOSIS — E7800 Pure hypercholesterolemia, unspecified: Secondary | ICD-10-CM | POA: Diagnosis present

## 2022-05-29 DIAGNOSIS — G9341 Metabolic encephalopathy: Secondary | ICD-10-CM | POA: Diagnosis not present

## 2022-05-29 DIAGNOSIS — Z7982 Long term (current) use of aspirin: Secondary | ICD-10-CM

## 2022-05-29 DIAGNOSIS — Z888 Allergy status to other drugs, medicaments and biological substances status: Secondary | ICD-10-CM

## 2022-05-29 DIAGNOSIS — J159 Unspecified bacterial pneumonia: Secondary | ICD-10-CM | POA: Diagnosis present

## 2022-05-29 HISTORY — DX: Cerebral infarction, unspecified: I63.9

## 2022-05-29 HISTORY — DX: Unspecified atrial fibrillation: I48.91

## 2022-05-29 LAB — BASIC METABOLIC PANEL
Anion gap: 15 (ref 5–15)
BUN: 31 mg/dL — ABNORMAL HIGH (ref 8–23)
CO2: 22 mmol/L (ref 22–32)
Calcium: 9.7 mg/dL (ref 8.9–10.3)
Chloride: 96 mmol/L — ABNORMAL LOW (ref 98–111)
Creatinine, Ser: 1.54 mg/dL — ABNORMAL HIGH (ref 0.61–1.24)
GFR, Estimated: 51 mL/min — ABNORMAL LOW (ref >60)
Glucose, Bld: 101 mg/dL — ABNORMAL HIGH (ref 70–99)
Potassium: 3.5 mmol/L (ref 3.5–5.1)
Sodium: 133 mmol/L — ABNORMAL LOW (ref 135–145)

## 2022-05-29 LAB — CBC
HCT: 40.2 % (ref 39.0–52.0)
Hemoglobin: 13.6 g/dL (ref 13.0–17.0)
MCH: 30.4 pg (ref 26.0–34.0)
MCHC: 33.8 g/dL (ref 30.0–36.0)
MCV: 89.7 fL (ref 80.0–100.0)
Platelets: 222 10*3/uL (ref 150–400)
RBC: 4.48 MIL/uL (ref 4.22–5.81)
RDW: 14 % (ref 11.5–15.5)
WBC: 8.9 10*3/uL (ref 4.0–10.5)
nRBC: 0 % (ref 0.0–0.2)

## 2022-05-29 LAB — SARS CORONAVIRUS 2 BY RT PCR: SARS Coronavirus 2 by RT PCR: NEGATIVE

## 2022-05-29 LAB — PROCALCITONIN: Procalcitonin: 7.76 ng/mL

## 2022-05-29 LAB — D-DIMER, QUANTITATIVE: D-Dimer, Quant: 1.46 ug/mL-FEU — ABNORMAL HIGH (ref 0.00–0.50)

## 2022-05-29 MED ORDER — SODIUM CHLORIDE 0.9 % IV SOLN: INTRAVENOUS | Status: DC

## 2022-05-29 MED ORDER — ZOLPIDEM TARTRATE 5 MG PO TABS
10.0000 mg | ORAL_TABLET | Freq: Every evening | ORAL | Status: DC | PRN
Start: 2022-05-29 — End: 2022-05-31
  Administered 2022-05-29 – 2022-05-30 (×2): 10 mg via ORAL
  Filled 2022-05-29 (×2): qty 2

## 2022-05-29 MED ORDER — SODIUM CHLORIDE 0.9 % IV SOLN
100.0000 mg | Freq: Two times a day (BID) | INTRAVENOUS | Status: DC
  Administered 2022-05-30 – 2022-05-31 (×3): 100 mg via INTRAVENOUS
  Filled 2022-05-29 (×4): qty 100

## 2022-05-29 MED ORDER — ALBUTEROL SULFATE HFA 108 (90 BASE) MCG/ACT IN AERS
2.0000 | INHALATION_SPRAY | RESPIRATORY_TRACT | Status: DC | PRN
  Administered 2022-05-29: 2 via RESPIRATORY_TRACT
  Filled 2022-05-29: qty 6.7

## 2022-05-29 MED ORDER — ATORVASTATIN CALCIUM 40 MG PO TABS
80.0000 mg | ORAL_TABLET | Freq: Every day | ORAL | Status: DC
  Administered 2022-05-30 – 2022-05-31 (×2): 80 mg via ORAL
  Filled 2022-05-29 (×2): qty 2

## 2022-05-29 MED ORDER — APIXABAN 5 MG PO TABS
5.0000 mg | ORAL_TABLET | Freq: Two times a day (BID) | ORAL | Status: DC
  Administered 2022-05-29 – 2022-05-31 (×4): 5 mg via ORAL
  Filled 2022-05-29 (×4): qty 1

## 2022-05-29 MED ORDER — POLYETHYLENE GLYCOL 3350 17 G PO PACK: 17.0000 g | PACK | Freq: Every day | ORAL | Status: DC | PRN

## 2022-05-29 MED ORDER — ACETAMINOPHEN 325 MG PO TABS
650.0000 mg | ORAL_TABLET | Freq: Four times a day (QID) | ORAL | Status: DC | PRN
  Administered 2022-05-30 – 2022-05-31 (×4): 650 mg via ORAL
  Filled 2022-05-29 (×4): qty 2

## 2022-05-29 MED ORDER — SODIUM CHLORIDE 0.9 % IV BOLUS
1000.0000 mL | Freq: Once | INTRAVENOUS | Status: AC
  Administered 2022-05-29: 1000 mL via INTRAVENOUS

## 2022-05-29 MED ORDER — ICOSAPENT ETHYL 1 G PO CAPS
1.0000 g | ORAL_CAPSULE | Freq: Two times a day (BID) | ORAL | Status: DC
Start: 2022-05-30 — End: 2022-05-30

## 2022-05-29 MED ORDER — VENLAFAXINE HCL ER 150 MG PO CP24
150.0000 mg | ORAL_CAPSULE | Freq: Every day | ORAL | Status: DC
  Administered 2022-05-30 – 2022-05-31 (×2): 150 mg via ORAL
  Filled 2022-05-29 (×2): qty 1

## 2022-05-29 MED ORDER — ACETAMINOPHEN 650 MG RE SUPP: 650.0000 mg | Freq: Four times a day (QID) | RECTAL | Status: DC | PRN

## 2022-05-29 MED ORDER — LOSARTAN POTASSIUM 50 MG PO TABS
100.0000 mg | ORAL_TABLET | Freq: Every day | ORAL | Status: DC
  Administered 2022-05-30 – 2022-05-31 (×2): 100 mg via ORAL
  Filled 2022-05-29 (×2): qty 2

## 2022-05-29 MED ORDER — SODIUM CHLORIDE 0.9% FLUSH
3.0000 mL | Freq: Two times a day (BID) | INTRAVENOUS | Status: DC
  Administered 2022-05-29 – 2022-05-31 (×2): 3 mL via INTRAVENOUS

## 2022-05-29 MED ORDER — ALBUTEROL SULFATE (2.5 MG/3ML) 0.083% IN NEBU
2.5000 mg | INHALATION_SOLUTION | RESPIRATORY_TRACT | Status: DC | PRN
Start: 2022-05-29 — End: 2022-05-31

## 2022-05-29 MED ORDER — SODIUM CHLORIDE 0.9 % IV SOLN
100.0000 mg | Freq: Two times a day (BID) | INTRAVENOUS | Status: DC
  Administered 2022-05-29: 100 mg via INTRAVENOUS
  Filled 2022-05-29 (×2): qty 100

## 2022-05-29 MED ORDER — SODIUM CHLORIDE 0.9 % IV SOLN
1.0000 g | INTRAVENOUS | Status: DC
  Administered 2022-05-30: 1 g via INTRAVENOUS
  Filled 2022-05-29 (×2): qty 10

## 2022-05-29 MED ORDER — SODIUM CHLORIDE 0.9 % IV SOLN
1.0000 g | Freq: Every day | INTRAVENOUS | Status: DC
  Administered 2022-05-29: 1 g via INTRAVENOUS

## 2022-05-29 NOTE — ED Triage Notes (Signed)
Pt is on eliquis for afib,

## 2022-05-29 NOTE — Progress Notes (Signed)
61 year male with history of hypertension, paroxysmal A-fib, hyperlipidemia who presented to med Center at [facility] with complaint of fever, weakness, cough.  As per the report, he was also confused at home.  He is a non-smoker.  On condition he was overall hemodynamically stable, but required 3 liters of oxygen per minute.  Lab work showed creatinine of 1.5.  WBC was normal.  Chest x-ray showed right lower lobe consolidation.  Patient being admitted for community-acquired pneumonia.  Started on Rocephin, doxycycline.  Started on IV fluids for AKI

## 2022-05-29 NOTE — ED Triage Notes (Signed)
Pt started with fevers on Friday 102 max, seen at urgent care , negative covid. Today is confused. Pt states he feels foggy headed. Still has fever 101 at home with tylenol around the clock.

## 2022-05-29 NOTE — ED Notes (Signed)
ED Provider at bedside. 

## 2022-05-29 NOTE — ED Provider Notes (Signed)
MEDCENTER [facility] EMERGENCY DEPT Provider Note   CSN: [id number] Arrival date & time: 05/29/22  1526     History {Add pertinent medical, surgical, social history, OB history to HPI:1} Chief Complaint  Patient presents with   Altered Mental Status    Brett Humphrey is a 61 y.o. male presenting to ED with cough, fever, weakness ongoing for 3 days.  Denies chest pain.  Reports that he is feeling confused, which wife confirms at the bedside.  Had negative outpatient COVID test.  Reports history of pneumonia "for 5 years ago".  He does not smoke.  Does not wear oxygen at home.  HPI     Home Medications Prior to Admission medications   Medication Sig Start Date End Date Taking? Authorizing Provider  apixaban (ELIQUIS) 5 MG TABS tablet Take 5 mg by mouth 2 (two) times daily.    [provider]  atorvastatin (LIPITOR) 40 MG tablet Take 40 mg by mouth daily. 09/14/17   [provider]  atorvastatin (LIPITOR) 40 MG tablet TAKE 1 TABLET BY MOUTH ONCE DAILY 04/20/20 04/20/21  Wilfrid Lund, PA  famotidine (PEPCID) 20 MG tablet TAKE 1 TABLET BY MOUTH TWICE DAILY 04/20/20 04/20/21  Wilfrid Lund, PA  ferrous sulfate 325 (65 FE) MG tablet Take 325 mg by mouth daily with breakfast.    [provider]  gabapentin (NEURONTIN) 300 MG capsule Take 300 mg by mouth 3 (three) times daily. Patient not taking: Reported on 07/27/2021 05/07/18   [provider]  icosapent Ethyl (VASCEPA) 1 g capsule TAKE 2 CAPSULES BY MOUTH TWICE DAILY WITH MEALS 04/20/20 04/20/21  Wilfrid Lund, PA  losartan (COZAAR) 100 MG tablet Take 100 mg by mouth daily. 09/22/17   [provider]  losartan (COZAAR) 100 MG tablet TAKE 1 TABLET BY MOUTH ONCE DAILY 04/20/20 04/20/21  Wilfrid Lund, PA  lubiprostone (AMITIZA) 24 MCG capsule Take 1 capsule (24 mcg total) by mouth 2 (two) times daily with a meal. Patient not taking: Reported on 07/27/2021 06/07/18   Mayo, Baxter Kail, PA-C   methocarbamol (ROBAXIN) 500 MG tablet Take 1 tablet (500 mg total) by mouth 3 (three) times daily. Patient not taking: Reported on 07/27/2021 06/07/18   Mayo, Baxter Kail, PA-C  Multiple Vitamin (MULTIVITAMIN) capsule Take 1 capsule by mouth daily.    [provider]  nicotine (NICODERM CQ - DOSED IN MG/24 HOURS) 21 mg/24hr patch Place 21 mg onto the skin daily. Patient not taking: Reported on 07/27/2021    [provider]  ondansetron (ZOFRAN ODT) 4 MG disintegrating tablet Take 1 tablet (4 mg total) by mouth every 8 (eight) hours as needed for nausea or vomiting. Patient not taking: Reported on 07/27/2021 06/07/18   Mayo, Baxter Kail, PA-C  ranitidine (ZANTAC) 150 MG tablet Take 150 mg by mouth 2 (two) times daily. Patient not taking: Reported on 07/27/2021 09/05/17   [provider]  Testosterone 30 MG/ACT SOLN Apply 1 application topically daily. Patient not taking: Reported on 07/27/2021 10/20/17   [provider]  Testosterone 30 MG/ACT SOLN APPLY 2 PUMPS TO THE SKIN OF THE UNDERARM EVERY MORNING 09/29/20 03/28/21  Wilfrid Lund, PA  VASCEPA 1 g CAPS Take 2 g by mouth 2 (two) times daily. 10/12/17   [provider]  venlafaxine XR (EFFEXOR-XR) 150 MG 24 hr capsule TAKE 1 CAPSULE BY MOUTH ONCE DAILY WITH FOOD 04/20/20 04/20/21  Wilfrid Lund, PA  venlafaxine XR (EFFEXOR-XR) 75 MG 24 hr  capsule Take 150 mg by mouth daily with breakfast.    [provider]  zolpidem (AMBIEN) 10 MG tablet TAKE 1 TABLET BY MOUTH ONCE DAILY AT BEDTIME AS NEEDED Patient taking differently: Take 10 mg by mouth at bedtime as needed. 09/28/20 03/27/21  Roslynn Amble A, PA      Allergies    Simvastatin    Review of Systems   Review of Systems  Physical Exam Updated Vital Signs BP 104/74 (BP Location: Left Arm)   Pulse (!) 105   Temp 98.1 F (36.7 C)   Resp 18   Ht 5\' 10"  (1.778 m)   Wt 88.3 kg   SpO2 93%   BMI 27.93 kg/m  Physical  Exam Constitutional:      Appearance: He is ill-appearing.  HENT:     Head: Normocephalic and atraumatic.  Eyes:     Conjunctiva/sclera: Conjunctivae normal.     Pupils: Pupils are equal, round, and reactive to light.  Cardiovascular:     Rate and Rhythm: Normal rate and regular rhythm.  Pulmonary:     Effort: Pulmonary effort is normal. No respiratory distress.     Comments: 88% on room air, 96% on 3L [place] Diminished breath sounds in right lower lobe, no wheezing, speaking full sentences Abdominal:     General: There is no distension.     Tenderness: There is no abdominal tenderness.  Skin:    General: Skin is warm and dry.  Neurological:     General: No focal deficit present.     Mental Status: He is alert. Mental status is at baseline.  Psychiatric:        Mood and Affect: Mood normal.        Behavior: Behavior normal.     ED Results / Procedures / Treatments   Labs (all labs ordered are listed, but only abnormal results are displayed) Labs Reviewed  BASIC METABOLIC PANEL - Abnormal; Notable for the following components:      Result Value   Sodium 133 (*)    Chloride 96 (*)    Glucose, Bld 101 (*)    BUN 31 (*)    Creatinine, Ser 1.54 (*)    GFR, Estimated 51 (*)    All other components within normal limits  D-DIMER, QUANTITATIVE - Abnormal; Notable for the following components:   D-Dimer, Quant 1.46 (*)    All other components within normal limits  SARS CORONAVIRUS 2 BY RT PCR  CBC    EKG None  Radiology No results found.  Procedures Procedures  {Document cardiac monitor, telemetry assessment procedure when appropriate:1}  Medications Ordered in ED Medications  albuterol (VENTOLIN HFA) 108 (90 Base) MCG/ACT inhaler 2 puff (has no administration in time range)    ED Course/ Medical Decision Making/ A&P                           Medical Decision Making Amount and/or Complexity of Data Reviewed Labs: ordered. Radiology:  ordered.  Risk Prescription drug management. Decision regarding hospitalization.   This patient presents to the ED with concern for weakness, cough, congestion, fever. This involves an extensive number of treatment options, and is a complaint that carries with it a high risk of complications and morbidity.  The differential diagnosis includes fact including pneumonia versus viral URI versus anemia versus other  Additional history obtained from family member at bedside   I ordered and personally interpreted labs.  The pertinent  results include: No leukocytosis, D-dimer is elevated, BMP shows elevation of BUN and creatinine consistent with some prerenal mild AKI (patient not drinking a lot of water).  COVID is negative  I ordered imaging studies including x-ray of the chest I independently visualized and interpreted imaging which showed right lower lobe infiltrate I agree with the radiologist interpretation  The patient was maintained on a cardiac monitor.  I personally viewed and interpreted the cardiac monitored which showed an underlying rhythm of: A-fib rate controlled  Per my interpretation the patient's ECG shows A-fib rate controlled  I ordered medication including Rocephin and doxycycline for community pneumonia.  Fluid bolus for prerenal AKI.  I have reviewed the patients home medicines and have made adjustments as needed  Test Considered: Is compliant with Eliquis for A-fib and have a lower suspicion for PE in this clinical setting do not believe he needs CT angiogram at this time.  After the interventions noted above, I reevaluated the patient and found that they have: stayed the same  Patient remained stable on 2 to 3 L nasal cannula for medical admission for pneumonia.  He does not show signs or symptoms of sepsis at this time.  Dispostion:  After consideration of the diagnostic results and the patients response to treatment, I feel that the patent would benefit from  medical admission.   {Document critical care time when appropriate:1} {Document review of labs and clinical decision tools ie heart score, Chads2Vasc2 etc:1}  {Document your independent review of radiology images, and any outside records:1} {Document your discussion with family members, caretakers, and with consultants:1} {Document social determinants of health affecting pt's care:1} {Document your decision making why or why not admission, treatments were needed:1} Final Clinical Impression(s) / ED Diagnoses Final diagnoses:  None    Rx / DC Orders ED Discharge Orders     None

## 2022-05-29 NOTE — ED Notes (Signed)
RT note: Pt. seen once in rm #3, family member @ bedside, was on room air with Oxygen saturations 92-93%, per protocol with also elevated temp., placed pt. on 3 lpm n/c. Pt. stated, "has been coughing up some rust colored Mucus", decreased b/l b.s. auscultated, RT assessment done/charted.

## 2022-05-29 NOTE — H&P (Signed)
History and Physical   JLON BETKER ONG:[id number] DOB: [birth year] DOA: 05/29/2022  PCP: Wilfrid Lund, PA   Patient coming from: Home  Chief Complaint: Altered mental status, fever, cough  HPI: Brett Humphrey is a 61 y.o. male with medical history significant of hyperlipidemia, GERD, panic disorder, OSA on CPAP, degenerative disc disease, CVA, atrial fibrillation, hypertension presenting with fever cough and confusion.  Patient reports 3 days of symptoms consisting of fever cough and weakness with some intermittent confusion.  Wife confirms the confusion.  Tested negative for COVID outpatient recently.  Reports some shortness of breath.  He denies chills, chest pain, abdominal pain, constipation, diarrhea, nausea, vomiting.  ED Course: Vital signs in the ED significant for respirate in the teens to 20s requiring 2 to 3 L to maintain saturations.  Heart rate in the 80s to 100s.  Lab work-up included BMP with sodium 133, chloride 96, BUN 31, creatinine elevated to 1.54 from baseline near 1, glucose 101.  CBC within normal limits.  D-dimer mildly elevated at 1.46.  COVID screening negative.  Chest x-ray showing right lower lobe consolidation consistent with pneumonia.  Ceftriaxone and doxycycline given in the ED as well as a dose of albuterol and a liter of fluids.  Started on a rate of 75 cc an hour.  Admission requested for acute hypoxic respiratory failure and pneumonia.  Review of Systems: As per HPI otherwise all other systems reviewed and are negative.  Past Medical History:  Diagnosis Date   A-fib [facility])    Arthritis    back   CVA (cerebral vascular accident) [facility])    ED (erectile dysfunction)    Esophageal reflux    GERD (gastroesophageal reflux disease)    Hypercholesteremia    Hyperlipidemia    Hypertension    Male hypogonadism    OSA (obstructive sleep apnea)    Severe w AHI 37/hr now on CPAP at 14cm H2O   Panic disorder without agoraphobia     Past Surgical History:   Procedure Laterality Date   ANTERIOR CERVICAL DECOMPRESSION/DISCECTOMY FUSION 4 LEVELS N/A 06/06/2018   Procedure: ANTERIOR CERVICAL DECOMPRESSION/DISCECTOMY FUSION C3-7;  Surgeon: Venita Lick, MD;  Location: [facility] OR;  Service: Orthopedics;  Laterality: N/A;  5 hrs   BACK SURGERY  06/12/2017   lumbar L4-5   COLONOSCOPY     EYE SURGERY     bilateral cataract with lens implants   HERNIA REPAIR     right inguinal   INGUINAL HERNIA REPAIR Right 11/17/2017   Procedure: OPEN RIGHT HERNIA REPAIR INGUINAL ADULT;  Surgeon: Axel Filler, MD;  Location: [facility] ORS;  Service: General;  Laterality: Right;   INSERTION OF MESH Right 11/17/2017   Procedure: INSERTION OF MESH;  Surgeon: Axel Filler, MD;  Location: [facility] ORS;  Service: General;  Laterality: Right;   POSTERIOR CERVICAL FUSION/FORAMINOTOMY N/A 06/07/2018   Procedure: Posterior spinal fusion interbody C3-7;  Surgeon: Venita Lick, MD;  Location: [facility] OR;  Service: Orthopedics;  Laterality: N/A;  4 hrs   RHINOPLASTY     20 years ago   ROTATOR CUFF REPAIR     right shoulder Dr Magnus Ivan    Social History  reports that he quit smoking about 17 years ago. His smoking use included cigarettes. He has never used smokeless tobacco. He reports current alcohol use. He reports that he does not use drugs.  Allergies  Allergen Reactions   Simvastatin      myalgias     Family History  Problem Relation  Age of Onset   Heart attack Father    Cancer Mother    Cancer Brother    Asthma Sister   Reviewed on admission  Prior to Admission medications   Medication Sig Start Date End Date Taking? Authorizing Provider  apixaban (ELIQUIS) 5 MG TABS tablet Take 5 mg by mouth 2 (two) times daily.    [provider]  atorvastatin (LIPITOR) 40 MG tablet TAKE 1 TABLET BY MOUTH ONCE DAILY 04/20/20 04/20/21  Lois Huxley, PA  famotidine (PEPCID) 20 MG tablet TAKE 1 TABLET BY MOUTH TWICE DAILY 04/20/20 04/20/21  Lois Huxley, PA  ferrous sulfate 325 (65  FE) MG tablet Take 325 mg by mouth daily with breakfast.    [provider]  icosapent Ethyl (VASCEPA) 1 g capsule TAKE 2 CAPSULES BY MOUTH TWICE DAILY WITH MEALS 04/20/20 04/20/21  Lois Huxley, PA  losartan (COZAAR) 100 MG tablet Take 100 mg by mouth daily. 09/22/17   [provider]  losartan (COZAAR) 100 MG tablet TAKE 1 TABLET BY MOUTH ONCE DAILY 04/20/20 04/20/21  Lois Huxley, PA  Multiple Vitamin (MULTIVITAMIN) capsule Take 1 capsule by mouth daily.    [provider]  Testosterone 30 MG/ACT SOLN APPLY 2 PUMPS TO THE SKIN OF THE UNDERARM EVERY MORNING 09/29/20 03/28/21  Lois Huxley, PA  venlafaxine XR (EFFEXOR-XR) 150 MG 24 hr capsule TAKE 1 CAPSULE BY MOUTH ONCE DAILY WITH FOOD 04/20/20 04/20/21  Lois Huxley, PA  zolpidem (AMBIEN) 10 MG tablet TAKE 1 TABLET BY MOUTH ONCE DAILY AT BEDTIME AS NEEDED Patient taking differently: Take 10 mg by mouth at bedtime as needed. 09/28/20 03/27/21  Ephriam Jenkins, PA    Physical Exam: Vitals:   05/29/22 1900 05/29/22 1930 05/29/22 1945 05/29/22 2017  BP: 114/71 101/68 118/74 130/72  Pulse: 82 80 77 89  Resp: 18 (!) 21 16 20   Temp:    99.5 F (37.5 C)  TempSrc:      SpO2: 96% 94% 97% 94%  Weight:    87.4 kg  Height:        Physical Exam Constitutional:      General: He is not in acute distress.    Appearance: Normal appearance.  HENT:     Head: Normocephalic and atraumatic.     Mouth/Throat:     Mouth: Mucous membranes are moist.     Pharynx: Oropharynx is clear.  Eyes:     Extraocular Movements: Extraocular movements intact.     Pupils: Pupils are equal, round, and reactive to light.  Cardiovascular:     Rate and Rhythm: Normal rate and regular rhythm.     Pulses: Normal pulses.     Heart sounds: Normal heart sounds.  Pulmonary:     Effort: Pulmonary effort is normal. No respiratory distress.     Breath sounds: Rhonchi (Trace right sided) present.  Abdominal:     General: Bowel sounds are normal.  There is no distension.     Palpations: Abdomen is soft.     Tenderness: There is no abdominal tenderness.  Musculoskeletal:        General: No swelling or deformity.  Skin:    General: Skin is warm and dry.  Neurological:     General: No focal deficit present.     Mental Status: Mental status is at baseline.    Labs on Admission: I have personally reviewed following labs and imaging studies  CBC: Recent Labs  Lab 05/29/22 1551  WBC 8.9  HGB 13.6  HCT 40.2  MCV 89.7  PLT 222    Basic Metabolic Panel: Recent Labs  Lab 05/29/22 1551  NA 133*  K 3.5  CL 96*  CO2 22  GLUCOSE 101*  BUN 31*  CREATININE 1.54*  CALCIUM 9.7    GFR: Estimated Creatinine Clearance: 52 mL/min (A) (by C-G formula based on SCr of 1.54 mg/dL (H)).  Liver Function Tests: No results for input(s): "AST", "ALT", "ALKPHOS", "BILITOT", "PROT", "ALBUMIN" in the last 168 hours.  Urine analysis: No results found for: "COLORURINE", "APPEARANCEUR", "LABSPEC", "PHURINE", "GLUCOSEU", "HGBUR", "BILIRUBINUR", "KETONESUR", "PROTEINUR", "UROBILINOGEN", "NITRITE", "LEUKOCYTESUR"  Radiological Exams on Admission: DG Chest Port 1 View  Result Date: 05/29/2022 CLINICAL DATA:  Fever EXAM: PORTABLE CHEST 1 VIEW COMPARISON:  Chest x-ray dated June 11, 2018 FINDINGS: Cardiac and mediastinal contours are within normal limits. Right lower lobe consolidation, compatible with pneumonia. No large pleural effusion or pneumothorax. IMPRESSION: Right lower lobe consolidation, compatible with pneumonia. Recommend follow-up chest x-ray in 6-8 weeks to ensure resolution. Electronically Signed   By: Allegra Lai M.D.   On: 05/29/2022 16:57    EKG: Independently reviewed.  Sinus rhythm at 94 bpm.  Some nonspecific lateral T wave flattening.  Assessment/Plan Principal Problem:   Community acquired pneumonia Active Problems:   Hyperlipidemia   GERD (gastroesophageal reflux disease)   Panic disorder without  agoraphobia   OSA (obstructive sleep apnea)   Community-acquired pneumonia Acute respiratory failure with hypoxia > Patient presenting with cough, fever, confusion for the past 3 days. > Chest x-ray with right lower lobe consolidation consistent with pneumonia. > There is no leukocytosis.  Negative for COVID both outpatient and in ED. > Given lack of leukocytosis we will check full respiratory viral panel to rule out viral etiology.  We will also check procalcitonin to delineate between viral versus bacterial etiology. - Monitor on telemetry - Continue ceftriaxone and doxycycline as started in the ED - Follow-up full RVP - Procalcitonin - Trend fever curve and WBC - Supportive care  Hyperlipidemia History of CVA - Continue home atorvastatin and Vascepa  Atrial fibrillation - Continue home Eliquis   Hypertension - Continue home losartan  Panic disorder - Continue home venlafaxine  Insomnia - Continue home as needed Ambien  OSA - Continue home CPAP  DVT prophylaxis: Eliquis Code Status:   Full Family Communication:  None on admission.  Patient states that family was updated at med center. Disposition Plan:   Patient is from:  Home  Anticipated DC to:  Home  Anticipated DC date:  1 to 2 days  Anticipated DC barriers: None  Consults called:  None Admission status:  Observation, telemetry  Severity of Illness: The appropriate patient status for this patient is OBSERVATION. Observation status is judged to be reasonable and necessary in order to provide the required intensity of service to ensure the patient's safety. The patient's presenting symptoms, physical exam findings, and initial radiographic and laboratory data in the context of their medical condition is felt to place them at decreased risk for further clinical deterioration. Furthermore, it is anticipated that the patient will be medically stable for discharge from the hospital within 2 midnights of admission.     Synetta Fail MD [facility]  How to contact the [facility] Attending or Consulting provider 7A - 7P or covering provider during after hours 7P -7A, for this patient?   Check the care team in [facility] and look for a) attending/consulting TRH provider listed and  b) the [facility] team listed Log into [email or web address] and use [facility]'s universal password to access. If you do not have the password, please contact the hospital operator. Locate the [facility] provider you are looking for under [facility] and page to a number that you can be directly reached. If you still have difficulty reaching the provider, please page the [facility] (Director on Call) for the Hospitalists listed on amion for assistance.  05/29/2022, 8:57 PM

## 2022-05-30 DIAGNOSIS — J9601 Acute respiratory failure with hypoxia: Secondary | ICD-10-CM | POA: Diagnosis present

## 2022-05-30 DIAGNOSIS — E871 Hypo-osmolality and hyponatremia: Secondary | ICD-10-CM | POA: Diagnosis not present

## 2022-05-30 DIAGNOSIS — G4700 Insomnia, unspecified: Secondary | ICD-10-CM | POA: Diagnosis present

## 2022-05-30 DIAGNOSIS — Z87891 Personal history of nicotine dependence: Secondary | ICD-10-CM | POA: Diagnosis not present

## 2022-05-30 DIAGNOSIS — Z20822 Contact with and (suspected) exposure to covid-19: Secondary | ICD-10-CM | POA: Diagnosis present

## 2022-05-30 DIAGNOSIS — N179 Acute kidney failure, unspecified: Secondary | ICD-10-CM | POA: Diagnosis present

## 2022-05-30 DIAGNOSIS — Z825 Family history of asthma and other chronic lower respiratory diseases: Secondary | ICD-10-CM | POA: Diagnosis not present

## 2022-05-30 DIAGNOSIS — K219 Gastro-esophageal reflux disease without esophagitis: Secondary | ICD-10-CM | POA: Diagnosis present

## 2022-05-30 DIAGNOSIS — I10 Essential (primary) hypertension: Secondary | ICD-10-CM | POA: Diagnosis present

## 2022-05-30 DIAGNOSIS — I480 Paroxysmal atrial fibrillation: Secondary | ICD-10-CM | POA: Diagnosis present

## 2022-05-30 DIAGNOSIS — Z981 Arthrodesis status: Secondary | ICD-10-CM | POA: Diagnosis not present

## 2022-05-30 DIAGNOSIS — M5030 Other cervical disc degeneration, unspecified cervical region: Secondary | ICD-10-CM | POA: Diagnosis present

## 2022-05-30 DIAGNOSIS — E7800 Pure hypercholesterolemia, unspecified: Secondary | ICD-10-CM | POA: Diagnosis present

## 2022-05-30 DIAGNOSIS — Z7982 Long term (current) use of aspirin: Secondary | ICD-10-CM | POA: Diagnosis not present

## 2022-05-30 DIAGNOSIS — Z809 Family history of malignant neoplasm, unspecified: Secondary | ICD-10-CM | POA: Diagnosis not present

## 2022-05-30 DIAGNOSIS — J159 Unspecified bacterial pneumonia: Secondary | ICD-10-CM | POA: Diagnosis present

## 2022-05-30 DIAGNOSIS — G43909 Migraine, unspecified, not intractable, without status migrainosus: Secondary | ICD-10-CM | POA: Diagnosis present

## 2022-05-30 DIAGNOSIS — F410 Panic disorder [episodic paroxysmal anxiety] without agoraphobia: Secondary | ICD-10-CM | POA: Diagnosis present

## 2022-05-30 DIAGNOSIS — Z7901 Long term (current) use of anticoagulants: Secondary | ICD-10-CM | POA: Diagnosis not present

## 2022-05-30 DIAGNOSIS — Z8673 Personal history of transient ischemic attack (TIA), and cerebral infarction without residual deficits: Secondary | ICD-10-CM | POA: Diagnosis not present

## 2022-05-30 DIAGNOSIS — G4733 Obstructive sleep apnea (adult) (pediatric): Secondary | ICD-10-CM | POA: Diagnosis present

## 2022-05-30 DIAGNOSIS — Z8249 Family history of ischemic heart disease and other diseases of the circulatory system: Secondary | ICD-10-CM | POA: Diagnosis not present

## 2022-05-30 DIAGNOSIS — G9341 Metabolic encephalopathy: Secondary | ICD-10-CM | POA: Diagnosis present

## 2022-05-30 DIAGNOSIS — Z79899 Other long term (current) drug therapy: Secondary | ICD-10-CM | POA: Diagnosis not present

## 2022-05-30 DIAGNOSIS — J189 Pneumonia, unspecified organism: Secondary | ICD-10-CM | POA: Diagnosis present

## 2022-05-30 LAB — RESPIRATORY PANEL BY PCR

## 2022-05-30 LAB — COMPREHENSIVE METABOLIC PANEL
ALT: 22 U/L (ref 0–44)
AST: 25 U/L (ref 15–41)
Albumin: 2.7 g/dL — ABNORMAL LOW (ref 3.5–5.0)
Alkaline Phosphatase: 39 U/L (ref 38–126)
Anion gap: 8 (ref 5–15)
BUN: 24 mg/dL — ABNORMAL HIGH (ref 8–23)
CO2: 23 mmol/L (ref 22–32)
Calcium: 8 mg/dL — ABNORMAL LOW (ref 8.9–10.3)
Chloride: 101 mmol/L (ref 98–111)
Creatinine, Ser: 1.16 mg/dL (ref 0.61–1.24)
GFR, Estimated: >60 mL/min (ref >60)
Glucose, Bld: 105 mg/dL — ABNORMAL HIGH (ref 70–99)
Potassium: 3.5 mmol/L (ref 3.5–5.1)
Sodium: 132 mmol/L — ABNORMAL LOW (ref 135–145)
Total Bilirubin: 0.8 mg/dL (ref 0.3–1.2)
Total Protein: 6.1 g/dL — ABNORMAL LOW (ref 6.5–8.1)

## 2022-05-30 LAB — CBC
HCT: 35 % — ABNORMAL LOW (ref 39.0–52.0)
Hemoglobin: 11.7 g/dL — ABNORMAL LOW (ref 13.0–17.0)
MCH: 30.9 pg (ref 26.0–34.0)
MCHC: 33.4 g/dL (ref 30.0–36.0)
MCV: 92.3 fL (ref 80.0–100.0)
Platelets: 209 10*3/uL (ref 150–400)
RBC: 3.79 MIL/uL — ABNORMAL LOW (ref 4.22–5.81)
RDW: 14.2 % (ref 11.5–15.5)
WBC: 6.5 10*3/uL (ref 4.0–10.5)
nRBC: 0 % (ref 0.0–0.2)

## 2022-05-30 LAB — HIV ANTIBODY (ROUTINE TESTING W REFLEX): HIV Screen 4th Generation wRfx: NONREACTIVE

## 2022-05-30 LAB — PROCALCITONIN: Procalcitonin: 0.23 ng/mL

## 2022-05-30 MED ORDER — MELATONIN 3 MG PO TABS
3.0000 mg | ORAL_TABLET | Freq: Once | ORAL | Status: AC
  Administered 2022-05-30: 3 mg via ORAL
  Filled 2022-05-30: qty 1

## 2022-05-30 MED ORDER — ORAL CARE MOUTH RINSE: 15.0000 mL | OROMUCOSAL | Status: DC | PRN

## 2022-05-30 MED ORDER — ONDANSETRON HCL 4 MG/2ML IJ SOLN
4.0000 mg | Freq: Four times a day (QID) | INTRAMUSCULAR | Status: DC | PRN
  Administered 2022-05-30: 4 mg via INTRAVENOUS
  Filled 2022-05-30: qty 2

## 2022-05-30 MED ORDER — GABAPENTIN 300 MG PO CAPS
300.0000 mg | ORAL_CAPSULE | Freq: Three times a day (TID) | ORAL | Status: DC
  Administered 2022-05-30 (×3): 300 mg via ORAL
  Filled 2022-05-30 (×3): qty 1

## 2022-05-30 NOTE — TOC Initial Note (Signed)
Transition of Care [facility]) - Initial/Assessment Note    Patient Details  Name: Brett Humphrey MRN: [id number] Date of Birth: 11-Jun-1961  [facility] of [facility]) CM/SW Contact:    Golda Acre, RN Phone Number: 05/30/2022, 7:43 AM  Clinical Narrative:                  Transition of Care [facility]) Screening Note   Patient Details  Name: Brett Humphrey Date of Birth: 12-06-1960   Transition of Care [facility]) CM/SW Contact:    Golda Acre, RN Phone Number: 05/30/2022, 7:43 AM    Transition of Care Department [facility]) has reviewed patient and no TOC needs have been identified at this time. We will continue to monitor patient advancement through interdisciplinary progression rounds. If new patient transition needs arise, please place a TOC consult.    Expected Discharge Plan: Home/Self Care Barriers to Discharge: Continued Medical Work up   Patient Goals and CMS Choice Patient states their goals for this hospitalization and ongoing recovery are:: nos tated CMS Medicare.gov Compare Post Acute Care list provided to:: Patient Represenative (must comment) (wife) Choice offered to / list presented to : Spouse  Expected Discharge Plan and Services Expected Discharge Plan: Home/Self Care   Discharge Planning Services: CM Consult   Living arrangements for the past 2 months: Single Family Home                                      Prior Living Arrangements/Services Living arrangements for the past 2 months: Single Family Home Lives with:: Spouse Patient language and need for interpreter reviewed:: Yes Do you feel safe going back to the place where you live?: Yes            Criminal Activity/Legal Involvement Pertinent to Current Situation/Hospitalization: No - Comment as needed  Activities of Daily Living Home Assistive Devices/Equipment: CPAP ADL Screening (condition at time of admission) Patient's cognitive ability adequate to safely complete daily activities?:  Yes Is the patient deaf or have difficulty hearing?: No Does the patient have difficulty seeing, even when wearing glasses/contacts?: No Does the patient have difficulty concentrating, remembering, or making decisions?: No Patient able to express need for assistance with ADLs?: No Does the patient have difficulty dressing or bathing?: No Independently performs ADLs?: Yes (appropriate for developmental age) Does the patient have difficulty walking or climbing stairs?: No Weakness of Legs: None Weakness of Arms/Hands: None  Permission Sought/Granted                  Emotional Assessment Appearance:: Appears stated age Attitude/Demeanor/Rapport: Inconsistent Affect (typically observed): Calm Orientation: : Oriented to Self, Oriented to Situation, Oriented to Place Alcohol / Substance Use: Tobacco Use, Alcohol Use (tobacco-quit 17 years ago) Psych Involvement: No (comment)  Admission diagnosis:  Community acquired pneumonia [J18.9] AKI (acute kidney injury) (HCC) [N17.9] Community acquired pneumonia of right lower lobe of lung [J18.9] Patient Active Problem List   Diagnosis Date Noted   Community acquired pneumonia 05/29/2022   Acute respiratory failure with hypoxia (HCC) 05/29/2022   Elective surgery    DDD (degenerative disc disease), cervical 06/06/2018   S/P cervical spinal fusion 06/06/2018   Impaired spontaneous ventilation    Endotracheally intubated    Hyperlipidemia    GERD (gastroesophageal reflux disease)    Panic disorder without agoraphobia    Male hypogonadism    OSA (obstructive sleep apnea)  PCP:  Wilfrid Lund, PA Pharmacy:   [facility] [address], [address] [place] [address] Phone: [phone] Fax: [phone]     Social Determinants of Health (SDOH) Interventions    Readmission Risk Interventions   No data to display

## 2022-05-30 NOTE — Progress Notes (Signed)
Mobility Specialist - Progress Note     05/30/22 1514  Oxygen Therapy  O2 Device Nasal Cannula  O2 Flow Rate (L/min) 2 L/min  Mobility  Activity Ambulated with assistance in hallway  Range of Motion/Exercises Active  Level of Assistance Standby assist, set-up cues, supervision of patient - no hands on  Assistive Device None  Distance Ambulated (ft) 180 ft  Activity Response Tolerated well  $Mobility charge 1 Mobility   Pt was found in bed and agreeable to mobilize. Pt c/o constant headache and at EOS returned to recliner chair with all necessities in reach. RN was in room at EOS.  Billey Chang Mobility Specialist

## 2022-05-30 NOTE — Progress Notes (Addendum)
PROGRESS NOTE    Brett Humphrey  [id number] DOB: Oct 07, 1960 DOA: 05/29/2022 PCP: Lois Huxley, PA   Brief Narrative:  61 y.o. male with medical history significant of hyperlipidemia, GERD, panic disorder, OSA on CPAP, degenerative disc disease, CVA, atrial fibrillation, hypertension presented with fever, cough and confusion.  On presentation, he required supplemental oxygen, creatinine was slightly elevated at 1.54 from baseline of close to 1.  COVID testing was negative.  Chest x-ray showed right lower lobe consolidation.  He was started on IV fluids and antibiotics.  Assessment & Plan:   Community-acquired pneumonia Acute respiratory failure with hypoxia Acute metabolic encephalopathy -Presented with fever cough and confusion for 3 days prior to presentation.  Chest x-ray showed right lower lobe consolidation.  COVID testing negative. -Currently still on 2 L oxygen by nasal cannula.  Wean off as able.  Continue oral pain and doxycycline.  Respiratory virus panel negative as well.  DC isolation.  Acute kidney injury -creatinine 1.54 on presentation, baseline creatinine close to 1.  Improving to 1.16.  Decrease normal saline to 50 cc an hour.  Repeat a.m. labs.  Hyponatremia -Mild.  Continue IV fluids as above.  Monitor.  Hyperlipidemia History of unspecified CVA -Continue Eliquis and statin  Paroxysmal A-fib -Continue Eliquis.  Outpatient follow-up  Hypertension -Blood pressure stable.  Continue losartan  History of panic disorder -Continue venlafaxine  Insomnia -Continue as needed Ambien  OSA -Continue CPAP  Migraine -Patient complains of bad migraine this morning and states that he takes gabapentin for the same.  Resume gabapentin at a lower dose 300 mg 3 times a day which is half the dose that he takes at home  DVT prophylaxis: Eliquis Code Status: Full Family Communication: Wife at bedside Disposition Plan: Status is: Observation The patient will require  care spanning > 2 midnights and should be moved to inpatient because: Of severity of illness.  Need for IV antibiotics  Consultants: None  Procedures: None  Antimicrobials: Rocephin and doxycycline from 05/29/2022 onwards   Subjective: Patient seen and examined at bedside.  Pain slightly better but complains of severe headache.  No fever, vomiting, chest pain reported.  Objective: Vitals:   05/29/22 2017 05/30/22 0054 05/30/22 0610 05/30/22 0748  BP: 130/72 133/65 104/69 98/60  Pulse: 89 (!) 107 88 74  Resp: 20 20 20 20   Temp: 99.5 F (37.5 C) 98 F (36.7 C) 98.6 F (37 C) 97.7 F (36.5 C)  TempSrc:  Oral Oral Oral  SpO2: 94% 96% 94% 95%  Weight: 87.4 kg     Height:        Intake/Output Summary (Last 24 hours) at 05/30/2022 1046 Last data filed at 05/30/2022 0900 Gross per 24 hour  Intake 2370.1 ml  Output 475 ml  Net 1895.1 ml   Filed Weights   05/29/22 1632 05/29/22 2017  Weight: 88.3 kg 87.4 kg    Examination:  General exam: Appears calm and comfortable.  Currently on 2 L oxygen via nasal cannula. Respiratory system: Bilateral decreased breath sounds at bases with some scattered crackles Cardiovascular system: S1 & S2 heard, intermittent tachycardic gastrointestinal system: Abdomen is nondistended, soft and nontender. Normal bowel sounds heard. Extremities: No cyanosis, clubbing, edema  Central nervous system: Alert and oriented.  Slow to respond.  No focal neurological deficits. Moving extremities Skin: No rashes, lesions or ulcers Psychiatry: Flat.  Not agitated.   Data Reviewed: I have personally reviewed following labs and imaging studies  CBC: Recent Labs  Lab 05/29/22  1551 05/30/22 0433  WBC 8.9 6.5  HGB 13.6 11.7*  HCT 40.2 35.0*  MCV 89.7 92.3  PLT 222 209   Basic Metabolic Panel: Recent Labs  Lab 05/29/22 1551 05/30/22 0433  NA 133* 132*  K 3.5 3.5  CL 96* 101  CO2 22 23  GLUCOSE 101* 105*  BUN 31* 24*  CREATININE 1.54* 1.16   CALCIUM 9.7 8.0*   GFR: Estimated Creatinine Clearance: 69 mL/min (by C-G formula based on SCr of 1.16 mg/dL). Liver Function Tests: Recent Labs  Lab 05/30/22 0433  AST 25  ALT 22  ALKPHOS 39  BILITOT 0.8  PROT 6.1*  ALBUMIN 2.7*   No results for input(s): "LIPASE", "AMYLASE" in the last 168 hours. No results for input(s): "AMMONIA" in the last 168 hours. Coagulation Profile: No results for input(s): "INR", "PROTIME" in the last 168 hours. Cardiac Enzymes: No results for input(s): "CKTOTAL", "CKMB", "CKMBINDEX", "TROPONINI" in the last 168 hours. BNP (last 3 results) No results for input(s): "PROBNP" in the last 8760 hours. HbA1C: No results for input(s): "HGBA1C" in the last 72 hours. CBG: No results for input(s): "GLUCAP" in the last 168 hours. Lipid Profile: No results for input(s): "CHOL", "HDL", "LDLCALC", "TRIG", "CHOLHDL", "LDLDIRECT" in the last 72 hours. Thyroid Function Tests: No results for input(s): "TSH", "T4TOTAL", "FREET4", "T3FREE", "THYROIDAB" in the last 72 hours. Anemia Panel: No results for input(s): "VITAMINB12", "FOLATE", "FERRITIN", "TIBC", "IRON", "RETICCTPCT" in the last 72 hours. Sepsis Labs: Recent Labs  Lab 05/29/22 2228 05/30/22 0433  PROCALCITON 7.76 0.23    Recent Results (from the past 240 hour(s))  SARS Coronavirus 2 by RT PCR (hospital order, performed in [facility] hospital lab) *cepheid single result test* Anterior Nasal Swab     Status: None   Collection Time: 05/29/22  4:11 PM   Specimen: Anterior Nasal Swab  Result Value Ref Range Status   SARS Coronavirus 2 by RT PCR NEGATIVE NEGATIVE Final    Comment: (NOTE) SARS-CoV-2 target nucleic acids are NOT DETECTED.  The SARS-CoV-2 RNA is generally detectable in upper and lower respiratory specimens during the acute phase of infection. The lowest concentration of SARS-CoV-2 viral copies this assay can detect is 250 copies / mL. A negative result does not preclude SARS-CoV-2  infection and should not be used as the sole basis for treatment or other patient management decisions.  A negative result may occur with improper specimen collection / handling, submission of specimen other than nasopharyngeal swab, presence of viral mutation(s) within the areas targeted by this assay, and inadequate number of viral copies (<250 copies / mL). A negative result must be combined with clinical observations, patient history, and epidemiological information.  Fact Sheet for Patients:   RoadLapTop.co.za  Fact Sheet for Healthcare Providers: http://kim-miller.com/  This test is not yet approved or  cleared by the [place] FDA and has been authorized for detection and/or diagnosis of SARS-CoV-2 by FDA under an Emergency Use Authorization (EUA).  This EUA will remain in effect (meaning this test can be used) for the duration of the COVID-19 declaration under Section 564(b)(1) of the Act, 21 U.S.C. section 360bbb-3(b)(1), unless the authorization is terminated or revoked sooner.  Performed at [facility], [address], [place], [place] [address]   Respiratory (~20 pathogens) panel by PCR     Status: None   Collection Time: 05/29/22  9:37 PM   Specimen: Nasopharyngeal Swab; Respiratory  Result Value Ref Range Status   Adenovirus NOT DETECTED NOT DETECTED  Final   Coronavirus 229E NOT DETECTED NOT DETECTED Final    Comment: (NOTE) The Coronavirus on the Respiratory Panel, DOES NOT test for the novel  Coronavirus (2019 nCoV)    Coronavirus HKU1 NOT DETECTED NOT DETECTED Final   Coronavirus NL63 NOT DETECTED NOT DETECTED Final   Coronavirus OC43 NOT DETECTED NOT DETECTED Final   Metapneumovirus NOT DETECTED NOT DETECTED Final   Rhinovirus / Enterovirus NOT DETECTED NOT DETECTED Final   Influenza A NOT DETECTED NOT DETECTED Final   Influenza B NOT DETECTED NOT DETECTED Final   Parainfluenza Virus 1  NOT DETECTED NOT DETECTED Final   Parainfluenza Virus 2 NOT DETECTED NOT DETECTED Final   Parainfluenza Virus 3 NOT DETECTED NOT DETECTED Final   Parainfluenza Virus 4 NOT DETECTED NOT DETECTED Final   Respiratory Syncytial Virus NOT DETECTED NOT DETECTED Final   Bordetella pertussis NOT DETECTED NOT DETECTED Final   Bordetella Parapertussis NOT DETECTED NOT DETECTED Final   Chlamydophila pneumoniae NOT DETECTED NOT DETECTED Final   Mycoplasma pneumoniae NOT DETECTED NOT DETECTED Final    Comment: Performed at [facility], [address], [place], [place] [address]         Radiology Studies: DG Chest Port 1 View  Result Date: 05/29/2022 CLINICAL DATA:  Fever EXAM: PORTABLE CHEST 1 VIEW COMPARISON:  Chest x-ray dated June 11, 2018 FINDINGS: Cardiac and mediastinal contours are within normal limits. Right lower lobe consolidation, compatible with pneumonia. No large pleural effusion or pneumothorax. IMPRESSION: Right lower lobe consolidation, compatible with pneumonia. Recommend follow-up chest x-ray in 6-8 weeks to ensure resolution. Electronically Signed   By: Allegra Lai M.D.   On: 05/29/2022 16:57        Scheduled Meds:  apixaban  5 mg Oral BID   atorvastatin  80 mg Oral Daily   gabapentin  300 mg Oral TID   losartan  100 mg Oral Daily   sodium chloride flush  3 mL Intravenous Q12H   venlafaxine XR  150 mg Oral Q breakfast   Continuous Infusions:  sodium chloride 75 mL/hr at 05/30/22 0624   cefTRIAXone (ROCEPHIN)  IV     doxycycline (VIBRAMYCIN) IV 125 mL/hr at 05/30/22 3300          Glade Lloyd, MD [facility] 05/30/2022, 10:46 AM

## 2022-05-31 DIAGNOSIS — J9601 Acute respiratory failure with hypoxia: Secondary | ICD-10-CM | POA: Diagnosis not present

## 2022-05-31 DIAGNOSIS — G4733 Obstructive sleep apnea (adult) (pediatric): Secondary | ICD-10-CM | POA: Diagnosis not present

## 2022-05-31 DIAGNOSIS — J189 Pneumonia, unspecified organism: Secondary | ICD-10-CM | POA: Diagnosis not present

## 2022-05-31 LAB — BASIC METABOLIC PANEL
Anion gap: 7 (ref 5–15)
BUN: 14 mg/dL (ref 8–23)
CO2: 24 mmol/L (ref 22–32)
Calcium: 8.2 mg/dL — ABNORMAL LOW (ref 8.9–10.3)
Chloride: 106 mmol/L (ref 98–111)
Creatinine, Ser: 1.02 mg/dL (ref 0.61–1.24)
GFR, Estimated: >60 mL/min (ref >60)
Glucose, Bld: 95 mg/dL (ref 70–99)
Potassium: 3.6 mmol/L (ref 3.5–5.1)
Sodium: 137 mmol/L (ref 135–145)

## 2022-05-31 LAB — MAGNESIUM: Magnesium: 2.1 mg/dL (ref 1.7–2.4)

## 2022-05-31 LAB — PROCALCITONIN: Procalcitonin: 3.9 ng/mL

## 2022-05-31 MED ORDER — CEFUROXIME AXETIL 500 MG PO TABS: 500.0000 mg | ORAL_TABLET | Freq: Two times a day (BID) | ORAL | 0 refills | Status: AC

## 2022-05-31 MED ORDER — GABAPENTIN 300 MG PO CAPS
600.0000 mg | ORAL_CAPSULE | Freq: Three times a day (TID) | ORAL | Status: DC
  Administered 2022-05-31: 600 mg via ORAL
  Filled 2022-05-31: qty 2

## 2022-05-31 NOTE — TOC Transition Note (Signed)
Transition of Care [facility]) - CM/SW Discharge Note   Patient Details  Name: Brett Humphrey MRN: [id number] Date of Birth: December 03, 1960  [facility] CM/SW Contact:  Golda Acre, RN Phone Number: 05/31/2022, 11:19 AM   Clinical Narrative:    [id number]/patient discharged to return home.  Chart reviewed for TOC needs.  None found.  Patient self care.   Final next level of care: Home/Self Care Barriers to Discharge: Barriers Resolved   Patient Goals and CMS Choice Patient states their goals for this hospitalization and ongoing recovery are:: nos tated CMS Medicare.gov Compare Post Acute Care list provided to:: Patient Represenative (must comment) (wife) Choice offered to / list presented to : Spouse  Discharge Placement                       Discharge Plan and Services   Discharge Planning Services: CM Consult                                 Social Determinants of Health (SDOH) Interventions     Readmission Risk Interventions   No data to display

## 2022-05-31 NOTE — Discharge Summary (Signed)
Physician Discharge Summary  Brett Humphrey:[id number] DOB: October 24, 1960 DOA: 05/29/2022  PCP: Wilfrid Lund, PA  Admit date: 05/29/2022 Discharge date: 05/31/2022  Admitted From: Home Disposition: Home  Recommendations for Outpatient Follow-up:  Follow up with PCP in 1 week with repeat CBC/BMP Follow up in ED if symptoms worsen or new appear   Home Health: No Equipment/Devices: None  Discharge Condition: Stable CODE STATUS: Full Diet recommendation: Heart healthy  Brief/Interim Summary: 61 y.o. male with medical history significant of hyperlipidemia, GERD, panic disorder, OSA on CPAP, degenerative disc disease, CVA, atrial fibrillation, hypertension presented with fever, cough and confusion.  On presentation, he required supplemental oxygen, creatinine was slightly elevated at 1.54 from baseline of close to 1.  COVID testing was negative.  Chest x-ray showed right lower lobe consolidation.  He was started on IV fluids and antibiotics.  During the hospitalization, his condition has improved.  His respiratory status is much improved.  He is currently on room air and feels okay to go home today.  He will be discharged home today on oral antibiotics.  Discharge Diagnoses:   Community-acquired pneumonia Acute respiratory failure with hypoxia Acute metabolic encephalopathy -Presented with fever cough and confusion for 3 days prior to presentation.  Chest x-ray showed right lower lobe consolidation.  COVID testing negative. -Currently on Rocephin and doxycycline.  Respiratory virus panel negative as well.  -Required supplemental oxygen during the hospitalization.  Respiratory status has much improved.  Currently on room air.  Feels okay to go home today.  Discharge home today on oral Ceftin.    Acute kidney injury -creatinine 1.54 on presentation, baseline creatinine close to 1.  Improving to 1.02.  Treated with IV fluids.  Outpatient follow-up.  Hyponatremia -Mild.  Resolved.   Outpatient follow-up.  Hyperlipidemia History of unspecified CVA -Continue Eliquis and statin   Paroxysmal A-fib -Continue Eliquis.  Outpatient follow-up   Hypertension -Blood pressure stable.  Continue losartan   History of panic disorder -Continue venlafaxine   Insomnia -Continue as needed Ambien   OSA -Continue CPAP   Migraine -Patient takes 600 mg gabapentin 3 times a day and follows up with neurology for the same.  Continue same regimen on discharge.  Discharge Instructions  Discharge Instructions     Diet - low sodium heart healthy   Complete by: As directed    Increase activity slowly   Complete by: As directed       Allergies as of 05/31/2022       Reactions   Zocor [simvastatin] Other (See Comments)   Myalgias         Medication List     TAKE these medications    amiodarone 200 MG tablet Commonly known as: PACERONE Take 200 mg by mouth daily.   apixaban 5 MG Tabs tablet Commonly known as: ELIQUIS Take 5 mg by mouth 2 (two) times daily.   aspirin EC 81 MG tablet Take 81 mg by mouth daily. Swallow whole.   atorvastatin 80 MG tablet Commonly known as: LIPITOR Take 80 mg by mouth daily.   cefUROXime 500 MG tablet Commonly known as: CEFTIN Take 1 tablet (500 mg total) by mouth 2 (two) times daily for 5 days.   DRY EYE RELIEF OP Place 1-2 drops into both eyes daily as needed (Dry eyes).   ferrous sulfate 325 (65 FE) MG tablet Take 325 mg by mouth daily with breakfast.   gabapentin 600 MG tablet Commonly known as: NEURONTIN Take 600 mg by mouth 3 (  three) times daily.   losartan 100 MG tablet Commonly known as: COZAAR Take 100 mg by mouth daily.   multivitamin capsule Take 1 capsule by mouth daily.   omega-3 acid ethyl esters 1 g capsule Commonly known as: LOVAZA Take 2 capsules by mouth 2 (two) times daily.   Testim 50 MG/5GM (1%) Gel Generic drug: testosterone Place 10 g onto the skin daily.   venlafaxine XR 150 MG 24 hr  capsule Commonly known as: EFFEXOR-XR TAKE 1 CAPSULE BY MOUTH ONCE DAILY WITH FOOD What changed:  how much to take when to take this   zolpidem 10 MG tablet Commonly known as: AMBIEN TAKE 1 TABLET BY MOUTH ONCE DAILY AT BEDTIME AS NEEDED What changed:  how much to take reasons to take this        Follow-up Information     Lois Huxley, PA. Schedule an appointment as soon as possible for a visit in 1 week(s).   Specialty: Family Medicine Contact information: [place] [address] [phone]                Allergies  Allergen Reactions   Zocor [Simvastatin] Other (See Comments)    Myalgias     Consultations: None   Procedures/Studies: DG Chest Port 1 View  Result Date: 05/29/2022 CLINICAL DATA:  Fever EXAM: PORTABLE CHEST 1 VIEW COMPARISON:  Chest x-ray dated June 11, 2018 FINDINGS: Cardiac and mediastinal contours are within normal limits. Right lower lobe consolidation, compatible with pneumonia. No large pleural effusion or pneumothorax. IMPRESSION: Right lower lobe consolidation, compatible with pneumonia. Recommend follow-up chest x-ray in 6-8 weeks to ensure resolution. Electronically Signed   By: Yetta Glassman M.D.   On: 05/29/2022 16:57      Subjective: Patient seen and examined at bedside.  Feels much better and wants to go home today.  Denies fever, nausea, vomiting.  Still having migraine.  Discharge Exam: Vitals:   05/30/22 2040 05/31/22 0457  BP: 112/68 100/72  Pulse: 80 78  Resp: 18 20  Temp: 97.8 F (36.6 C) 97.7 F (36.5 C)  SpO2: 94% 98%    General: Pt is alert, awake, not in acute distress.   Cardiovascular: rate controlled, S1/S2 + Respiratory: bilateral decreased breath sounds at bases, scattered crackles Abdominal: Soft, NT, ND, bowel sounds + Extremities: no edema, no cyanosis    The results of significant diagnostics from this hospitalization (including imaging, microbiology, ancillary and  laboratory) are listed below for reference.     Microbiology: Recent Results (from the past 240 hour(s))  SARS Coronavirus 2 by RT PCR (hospital order, performed in [facility] hospital lab) *cepheid single result test* Anterior Nasal Swab     Status: None   Collection Time: 05/29/22  4:11 PM   Specimen: Anterior Nasal Swab  Result Value Ref Range Status   SARS Coronavirus 2 by RT PCR NEGATIVE NEGATIVE Final    Comment: (NOTE) SARS-CoV-2 target nucleic acids are NOT DETECTED.  The SARS-CoV-2 RNA is generally detectable in upper and lower respiratory specimens during the acute phase of infection. The lowest concentration of SARS-CoV-2 viral copies this assay can detect is 250 copies / mL. A negative result does not preclude SARS-CoV-2 infection and should not be used as the sole basis for treatment or other patient management decisions.  A negative result may occur with improper specimen collection / handling, submission of specimen other than nasopharyngeal swab, presence of viral mutation(s) within the areas targeted by  this assay, and inadequate number of viral copies (<250 copies / mL). A negative result must be combined with clinical observations, patient history, and epidemiological information.  Fact Sheet for Patients:   RoadLapTop.co.za  Fact Sheet for Healthcare Providers: http://kim-miller.com/  This test is not yet approved or  cleared by the [place] FDA and has been authorized for detection and/or diagnosis of SARS-CoV-2 by FDA under an Emergency Use Authorization (EUA).  This EUA will remain in effect (meaning this test can be used) for the duration of the COVID-19 declaration under Section 564(b)(1) of the Act, 21 U.S.C. section 360bbb-3(b)(1), unless the authorization is terminated or revoked sooner.  Performed at [facility], [address] [place], [place] [address]   Respiratory (~20  pathogens) panel by PCR     Status: None   Collection Time: 05/29/22  9:37 PM   Specimen: Nasopharyngeal Swab; Respiratory  Result Value Ref Range Status   Adenovirus NOT DETECTED NOT DETECTED Final   Coronavirus 229E NOT DETECTED NOT DETECTED Final    Comment: (NOTE) The Coronavirus on the Respiratory Panel, DOES NOT test for the novel  Coronavirus (2019 nCoV)    Coronavirus HKU1 NOT DETECTED NOT DETECTED Final   Coronavirus NL63 NOT DETECTED NOT DETECTED Final   Coronavirus OC43 NOT DETECTED NOT DETECTED Final   Metapneumovirus NOT DETECTED NOT DETECTED Final   Rhinovirus / Enterovirus NOT DETECTED NOT DETECTED Final   Influenza A NOT DETECTED NOT DETECTED Final   Influenza B NOT DETECTED NOT DETECTED Final   Parainfluenza Virus 1 NOT DETECTED NOT DETECTED Final   Parainfluenza Virus 2 NOT DETECTED NOT DETECTED Final   Parainfluenza Virus 3 NOT DETECTED NOT DETECTED Final   Parainfluenza Virus 4 NOT DETECTED NOT DETECTED Final   Respiratory Syncytial Virus NOT DETECTED NOT DETECTED Final   Bordetella pertussis NOT DETECTED NOT DETECTED Final   Bordetella Parapertussis NOT DETECTED NOT DETECTED Final   Chlamydophila pneumoniae NOT DETECTED NOT DETECTED Final   Mycoplasma pneumoniae NOT DETECTED NOT DETECTED Final    Comment: Performed at [facility], [address] [place], [place] [address]     Labs: BNP (last 3 results) No results for input(s): "BNP" in the last 8760 hours. Basic Metabolic Panel: Recent Labs  Lab 05/29/22 1551 05/30/22 0433 05/31/22 0549  NA 133* 132* 137  K 3.5 3.5 3.6  CL 96* 101 106  CO2 22 23 24   GLUCOSE 101* 105* 95  BUN 31* 24* 14  CREATININE 1.54* 1.16 1.02  CALCIUM 9.7 8.0* 8.2*  MG  --   --  2.1   Liver Function Tests: Recent Labs  Lab 05/30/22 0433  AST 25  ALT 22  ALKPHOS 39  BILITOT 0.8  PROT 6.1*  ALBUMIN 2.7*   No results for input(s): "LIPASE", "AMYLASE" in the last 168 hours. No results for input(s): "AMMONIA" in  the last 168 hours. CBC: Recent Labs  Lab 05/29/22 1551 05/30/22 0433  WBC 8.9 6.5  HGB 13.6 11.7*  HCT 40.2 35.0*  MCV 89.7 92.3  PLT 222 209   Cardiac Enzymes: No results for input(s): "CKTOTAL", "CKMB", "CKMBINDEX", "TROPONINI" in the last 168 hours. BNP: Invalid input(s): "POCBNP" CBG: No results for input(s): "GLUCAP" in the last 168 hours. D-Dimer Recent Labs    05/29/22 1551  DDIMER 1.46*   Hgb A1c No results for input(s): "HGBA1C" in the last 72 hours. Lipid Profile No results for input(s): "CHOL", "HDL", "LDLCALC", "TRIG", "CHOLHDL", "LDLDIRECT" in the last  72 hours. Thyroid function studies No results for input(s): "TSH", "T4TOTAL", "T3FREE", "THYROIDAB" in the last 72 hours.  Invalid input(s): "FREET3" Anemia work up No results for input(s): "VITAMINB12", "FOLATE", "FERRITIN", "TIBC", "IRON", "RETICCTPCT" in the last 72 hours. Urinalysis No results found for: "COLORURINE", "APPEARANCEUR", "LABSPEC", "PHURINE", "GLUCOSEU", "HGBUR", "BILIRUBINUR", "KETONESUR", "PROTEINUR", "UROBILINOGEN", "NITRITE", "LEUKOCYTESUR" Sepsis Labs Recent Labs  Lab 05/29/22 1551 05/30/22 0433  WBC 8.9 6.5   Microbiology Recent Results (from the past 240 hour(s))  SARS Coronavirus 2 by RT PCR (hospital order, performed in [facility] hospital lab) *cepheid single result test* Anterior Nasal Swab     Status: None   Collection Time: 05/29/22  4:11 PM   Specimen: Anterior Nasal Swab  Result Value Ref Range Status   SARS Coronavirus 2 by RT PCR NEGATIVE NEGATIVE Final    Comment: (NOTE) SARS-CoV-2 target nucleic acids are NOT DETECTED.  The SARS-CoV-2 RNA is generally detectable in upper and lower respiratory specimens during the acute phase of infection. The lowest concentration of SARS-CoV-2 viral copies this assay can detect is 250 copies / mL. A negative result does not preclude SARS-CoV-2 infection and should not be used as the sole basis for treatment or other patient  management decisions.  A negative result may occur with improper specimen collection / handling, submission of specimen other than nasopharyngeal swab, presence of viral mutation(s) within the areas targeted by this assay, and inadequate number of viral copies (<250 copies / mL). A negative result must be combined with clinical observations, patient history, and epidemiological information.  Fact Sheet for Patients:   https://www.patel.info/  Fact Sheet for Healthcare Providers: https://hall.com/  This test is not yet approved or  cleared by the [place] FDA and has been authorized for detection and/or diagnosis of SARS-CoV-2 by FDA under an Emergency Use Authorization (EUA).  This EUA will remain in effect (meaning this test can be used) for the duration of the COVID-19 declaration under Section 564(b)(1) of the Act, 21 U.S.C. section 360bbb-3(b)(1), unless the authorization is terminated or revoked sooner.  Performed at [facility], [address], [place], [place] [address]   Respiratory (~20 pathogens) panel by PCR     Status: None   Collection Time: 05/29/22  9:37 PM   Specimen: Nasopharyngeal Swab; Respiratory  Result Value Ref Range Status   Adenovirus NOT DETECTED NOT DETECTED Final   Coronavirus 229E NOT DETECTED NOT DETECTED Final    Comment: (NOTE) The Coronavirus on the Respiratory Panel, DOES NOT test for the novel  Coronavirus (2019 nCoV)    Coronavirus HKU1 NOT DETECTED NOT DETECTED Final   Coronavirus NL63 NOT DETECTED NOT DETECTED Final   Coronavirus OC43 NOT DETECTED NOT DETECTED Final   Metapneumovirus NOT DETECTED NOT DETECTED Final   Rhinovirus / Enterovirus NOT DETECTED NOT DETECTED Final   Influenza A NOT DETECTED NOT DETECTED Final   Influenza B NOT DETECTED NOT DETECTED Final   Parainfluenza Virus 1 NOT DETECTED NOT DETECTED Final   Parainfluenza Virus 2 NOT DETECTED NOT DETECTED  Final   Parainfluenza Virus 3 NOT DETECTED NOT DETECTED Final   Parainfluenza Virus 4 NOT DETECTED NOT DETECTED Final   Respiratory Syncytial Virus NOT DETECTED NOT DETECTED Final   Bordetella pertussis NOT DETECTED NOT DETECTED Final   Bordetella Parapertussis NOT DETECTED NOT DETECTED Final   Chlamydophila pneumoniae NOT DETECTED NOT DETECTED Final   Mycoplasma pneumoniae NOT DETECTED NOT DETECTED Final    Comment: Performed at [facility], [address] [place], [place] [address]     Time coordinating discharge: 35 minutes  SIGNED:   Aline August, MD  [facility] 05/31/2022, 11:09 AM

## 2022-05-31 NOTE — Progress Notes (Signed)
Patient and Patient's Family given discharge instructions including all Discharge Medications and schedules for these Medications. Understanding verbalized and discharge AVS with the Patient at time of discharge

## 2022-05-31 NOTE — Progress Notes (Signed)
Mobility Specialist - Progress Note     05/31/22 0938  Mobility  HOB Elevated/Bed Position Self regulated  Activity Ambulated independently in hallway  Range of Motion/Exercises Active  Level of Assistance Independent  Assistive Device None  Distance Ambulated (ft) 500 ft  Activity Response Tolerated well  $Mobility charge 1 Mobility   Pt was found in bed and agreeable to mobilize. Had no complaints during ambulation and at EOS returned back to bed with all necessities in reach.  Billey Chang Mobility Specialist

## 2022-07-05 ENCOUNTER — Ambulatory Visit
Admission: RE | Admit: 2022-07-05 | Discharge: 2022-07-05 | Disposition: A | Discharge status: Home / Self Care | Payer: 59 | Source: Ambulatory Visit | Attending: Family Medicine | Admitting: Family Medicine

## 2022-07-05 ENCOUNTER — Other Ambulatory Visit: Payer: Self-pay | Admitting: Family Medicine

## 2022-07-05 DIAGNOSIS — R509 Fever, unspecified: Secondary | ICD-10-CM

## 2022-07-05 DIAGNOSIS — R053 Chronic cough: Secondary | ICD-10-CM

## 2022-07-06 ENCOUNTER — Telehealth: Payer: Self-pay | Admitting: Allergy & Immunology

## 2022-07-06 DIAGNOSIS — B999 Unspecified infectious disease: Secondary | ICD-10-CM

## 2022-07-06 NOTE — Telephone Encounter (Signed)
Patient has a history of recurrent PNA. I am ordering an immunodeficiency screen given his history.   Salvatore Marvel, MD [facility] [place] [facility] [place]

## 2022-07-07 ENCOUNTER — Emergency Department (HOSPITAL_COMMUNITY)
Admission: EM | Admit: 2022-07-07 | Discharge: 2022-07-07 | Disposition: A | Discharge status: Home / Self Care | Payer: 59 | Attending: Emergency Medicine | Admitting: Emergency Medicine

## 2022-07-07 ENCOUNTER — Emergency Department (HOSPITAL_COMMUNITY): Payer: 59

## 2022-07-07 ENCOUNTER — Encounter (HOSPITAL_COMMUNITY): Payer: Self-pay

## 2022-07-07 ENCOUNTER — Other Ambulatory Visit (HOSPITAL_BASED_OUTPATIENT_CLINIC_OR_DEPARTMENT_OTHER): Payer: Self-pay

## 2022-07-07 DIAGNOSIS — Z79899 Other long term (current) drug therapy: Secondary | ICD-10-CM | POA: Insufficient documentation

## 2022-07-07 DIAGNOSIS — Z20822 Contact with and (suspected) exposure to covid-19: Secondary | ICD-10-CM | POA: Insufficient documentation

## 2022-07-07 DIAGNOSIS — Z7982 Long term (current) use of aspirin: Secondary | ICD-10-CM | POA: Diagnosis not present

## 2022-07-07 DIAGNOSIS — R0602 Shortness of breath: Secondary | ICD-10-CM | POA: Insufficient documentation

## 2022-07-07 DIAGNOSIS — J189 Pneumonia, unspecified organism: Secondary | ICD-10-CM

## 2022-07-07 LAB — CBC WITH DIFFERENTIAL/PLATELET
Abs Immature Granulocytes: 0.06 10*3/uL (ref 0.00–0.07)
Basophils Absolute: 0.1 10*3/uL (ref 0.0–0.1)
Basophils Relative: 1 %
Eosinophils Absolute: 0.4 10*3/uL (ref 0.0–0.5)
Eosinophils Relative: 4 %
HCT: 37.4 % — ABNORMAL LOW (ref 39.0–52.0)
Hemoglobin: 12.2 g/dL — ABNORMAL LOW (ref 13.0–17.0)
Immature Granulocytes: 1 %
Lymphocytes Relative: 17 %
Lymphs Abs: 1.8 10*3/uL (ref 0.7–4.0)
MCH: 30.8 pg (ref 26.0–34.0)
MCHC: 32.6 g/dL (ref 30.0–36.0)
MCV: 94.4 fL (ref 80.0–100.0)
Monocytes Absolute: 1 10*3/uL (ref 0.1–1.0)
Monocytes Relative: 10 %
Neutro Abs: 7.2 10*3/uL (ref 1.7–7.7)
Neutrophils Relative %: 67 %
Platelets: 345 10*3/uL (ref 150–400)
RBC: 3.96 MIL/uL — ABNORMAL LOW (ref 4.22–5.81)
RDW: 13.6 % (ref 11.5–15.5)
WBC: 10.6 10*3/uL — ABNORMAL HIGH (ref 4.0–10.5)
nRBC: 0 % (ref 0.0–0.2)

## 2022-07-07 LAB — COMPREHENSIVE METABOLIC PANEL
ALT: 50 U/L — ABNORMAL HIGH (ref 0–44)
AST: 46 U/L — ABNORMAL HIGH (ref 15–41)
Albumin: 3.2 g/dL — ABNORMAL LOW (ref 3.5–5.0)
Alkaline Phosphatase: 61 U/L (ref 38–126)
Anion gap: 10 (ref 5–15)
BUN: 14 mg/dL (ref 8–23)
CO2: 23 mmol/L (ref 22–32)
Calcium: 8.6 mg/dL — ABNORMAL LOW (ref 8.9–10.3)
Chloride: 100 mmol/L (ref 98–111)
Creatinine, Ser: 1.05 mg/dL (ref 0.61–1.24)
GFR, Estimated: >60 mL/min (ref >60)
Glucose, Bld: 143 mg/dL — ABNORMAL HIGH (ref 70–99)
Potassium: 3.8 mmol/L (ref 3.5–5.1)
Sodium: 133 mmol/L — ABNORMAL LOW (ref 135–145)
Total Bilirubin: 0.6 mg/dL (ref 0.3–1.2)
Total Protein: 7.6 g/dL (ref 6.5–8.1)

## 2022-07-07 LAB — RESP PANEL BY RT-PCR (FLU A&B, COVID) ARPGX2
Influenza A by PCR: NEGATIVE
Influenza B by PCR: NEGATIVE
SARS Coronavirus 2 by RT PCR: NEGATIVE

## 2022-07-07 MED ORDER — IOHEXOL 350 MG/ML SOLN
100.0000 mL | Freq: Once | INTRAVENOUS | Status: AC | PRN
  Administered 2022-07-07: 80 mL via INTRAVENOUS

## 2022-07-07 MED ORDER — CEFUROXIME AXETIL 500 MG PO TABS
500.0000 mg | ORAL_TABLET | Freq: Two times a day (BID) | ORAL | 0 refills | Status: AC
  Filled 2022-07-07: qty 14, 7d supply, fill #0

## 2022-07-07 MED ORDER — SODIUM CHLORIDE (PF) 0.9 % IJ SOLN
INTRAMUSCULAR | Status: AC
  Filled 2022-07-07: qty 50

## 2022-07-07 MED ORDER — DOXYCYCLINE HYCLATE 100 MG PO CAPS
100.0000 mg | ORAL_CAPSULE | Freq: Two times a day (BID) | ORAL | 0 refills | Status: DC
  Filled 2022-07-07: qty 14, 7d supply, fill #0

## 2022-07-07 NOTE — ED Provider Triage Note (Signed)
Emergency Medicine Provider Triage Evaluation Note  Brett Humphrey , a 61 y.o. male  was evaluated in triage.  Pt complains of shortness of breath. Started about a week ago while coughing. Coughing started when patient had pneumonia. Admitted to [facility] for treatment. Sent home with  Some associated CP. Worse with coughing. SOB and coughing has worsened. Seen Tuesday by PCP and started him on amox. Was advised to come to the ED if new improvement after a few days. Fever at home, 100 to 101. O2 sats at home have been low 90s.   Review of Systems  Positive: See above Negative: See above  Physical Exam  BP 125/76   Pulse 91   Temp 97.7 F (36.5 C) (Oral)   Resp 20   Ht 5\' 10"  (1.778 m)   Wt 86.2 kg   SpO2 96%   BMI 27.26 kg/m  Gen:   Awake, no distress   Resp:  Normal effort  MSK:   Moves extremities without difficulty  Other:  Crackles in right lower lung field  Medical Decision Making  Medically screening exam initiated at 1:39 PM.  Appropriate orders placed.  Brett Humphrey was informed that the remainder of the evaluation will be completed by another provider, this initial triage assessment does not replace that evaluation, and the importance of remaining in the ED until their evaluation is complete.   Cxr, basic labs    Harriet Pho, PA-C 07/07/22 1350

## 2022-07-07 NOTE — ED Triage Notes (Signed)
Pt presents with c/o shortness of breath. Pt reports that he was recently diagnosed with pneumonia and has been started on OTC cough syrup and antibiotics. Pt does have some labored breathing in triage.

## 2022-07-07 NOTE — ED Provider Notes (Signed)
[facility] DEPT Provider Note   CSN: [id number] Arrival date & time: 07/07/22  1153     History  Chief Complaint  Patient presents with   Shortness of Breath    Brett Humphrey is a 61 y.o. male.  61 year old male presents with acute shortness of breath times several days.  Diagnosed with pneumonia recently and had completed course of antibiotics.  Saw his doctor 2 days ago and was restarted on antibiotics due to concern for recurrence.  He notes some dyspnea as well as productive cough.  Has also had a fever.  Has been on antibiotics for the past 2 days.  Notes some pleuritic component to this without leg pain or swelling.  Denies any anginal chest pain.  No vomiting or diarrhea       Home Medications Prior to Admission medications   Medication Sig Start Date End Date Taking? Authorizing Provider  amiodarone (PACERONE) 200 MG tablet Take 200 mg by mouth daily. 12/04/21   [provider]  apixaban (ELIQUIS) 5 MG TABS tablet Take 5 mg by mouth 2 (two) times daily.    [provider]  aspirin EC 81 MG tablet Take 81 mg by mouth daily. Swallow whole.    [provider]  atorvastatin (LIPITOR) 80 MG tablet Take 80 mg by mouth daily. 05/14/22   [provider]  Carboxymethylcellulose Sodium (DRY EYE RELIEF OP) Place 1-2 drops into both eyes daily as needed (Dry eyes).    [provider]  ferrous sulfate 325 (65 FE) MG tablet Take 325 mg by mouth daily with breakfast.    [provider]  gabapentin (NEURONTIN) 600 MG tablet Take 600 mg by mouth 3 (three) times daily. 05/16/22   [provider]  losartan (COZAAR) 100 MG tablet Take 100 mg by mouth daily. 09/22/17   [provider]  Multiple Vitamin (MULTIVITAMIN) capsule Take 1 capsule by mouth daily.    [provider]  omega-3 acid ethyl esters (LOVAZA) 1 g capsule Take 2 capsules by mouth 2 (two) times daily. 04/28/22   [provider]  TESTIM 50 MG/5GM (1%) GEL Place 10 g onto the skin daily. 04/25/22   [provider]  venlafaxine XR (EFFEXOR-XR) 150 MG 24 hr capsule TAKE 1 CAPSULE BY MOUTH ONCE DAILY WITH FOOD Patient taking differently: Take 150 mg by mouth daily with breakfast. with food 04/20/20 07/09/22  Lois Huxley, PA  zolpidem (AMBIEN) 10 MG tablet TAKE 1 TABLET BY MOUTH ONCE DAILY AT BEDTIME AS NEEDED Patient taking differently: Take 10 mg by mouth at bedtime as needed for sleep. 09/28/20 07/09/22  Ephriam Jenkins, PA      Allergies    Zocor [simvastatin]    Review of Systems   Review of Systems  All other systems reviewed and are negative.   Physical Exam Updated Vital Signs BP 111/72 (BP Location: Right Arm)   Pulse 82   Temp 97.8 F (36.6 C) (Oral)   Resp (!) 21   Ht 1.778 m (5\' 10" )   Wt 86.2 kg   SpO2 95%   BMI 27.26 kg/m  Physical Exam Vitals and nursing note reviewed.  Constitutional:      General: He is not in acute distress.    Appearance: Normal appearance. He is well-developed. He is not toxic-appearing.  HENT:     Head: Normocephalic and atraumatic.  Eyes:     General: Lids are normal.     Conjunctiva/sclera: Conjunctivae  normal.     Pupils: Pupils are equal, round, and reactive to light.  Neck:     Thyroid: No thyroid mass.     Trachea: No tracheal deviation.  Cardiovascular:     Rate and Rhythm: Normal rate and regular rhythm.     Heart sounds: Normal heart sounds. No murmur heard.    No gallop.  Pulmonary:     Effort: Pulmonary effort is normal. No respiratory distress.     Breath sounds: No stridor. Examination of the right-upper field reveals decreased breath sounds and rhonchi. Decreased breath sounds and rhonchi present. No wheezing or rales.  Abdominal:     General: There is no distension.     Palpations: Abdomen is soft.     Tenderness: There is no abdominal tenderness. There is no rebound.  Musculoskeletal:        General: No tenderness.  Normal range of motion.     Cervical back: Normal range of motion and neck supple.  Skin:    General: Skin is warm and dry.     Findings: No abrasion or rash.  Neurological:     Mental Status: He is alert and oriented to person, place, and time. Mental status is at baseline.     GCS: GCS eye subscore is 4. GCS verbal subscore is 5. GCS motor subscore is 6.     Cranial Nerves: No cranial nerve deficit.     Sensory: No sensory deficit.     Motor: Motor function is intact.  Psychiatric:        Attention and Perception: Attention normal.        Speech: Speech normal.        Behavior: Behavior normal.     ED Results / Procedures / Treatments   Labs (all labs ordered are listed, but only abnormal results are displayed) Labs Reviewed  RESP PANEL BY RT-PCR (FLU A&B, COVID) [id number]  COMPREHENSIVE METABOLIC PANEL  CBC WITH DIFFERENTIAL/PLATELET    EKG EKG Interpretation  Date/Time:  Thursday July 07 2022 12:01:56 EDT Ventricular Rate:  92 PR Interval:  176 QRS Duration: 94 QT Interval:  358 QTC Calculation: 442 R Axis:   -21 Text Interpretation: Normal sinus rhythm Normal ECG When compared with ECG of 29-May-2022 16:31, PREVIOUS ECG IS PRESENT Confirmed by Lacretia Leigh ([id number]) on 07/07/2022 2:05:03 PM  Radiology DG Chest 2 View  Result Date: 07/07/2022 CLINICAL DATA:  Shortness of breath EXAM: CHEST - 2 VIEW COMPARISON:  07/05/2022, 05/29/2022 FINDINGS: The heart size and mediastinal contours are within normal limits. Similar degree of residual opacity at the periphery of the right lower lobe. Elsewhere, lungs are clear. No pleural effusion or pneumothorax. IMPRESSION: Stable radiographic appearance of the chest with a similar degree of residual opacity at the periphery of the right lower lobe. Electronically Signed   By: Davina Poke D.O.   On: 07/07/2022 12:41    Procedures Procedures    Medications Ordered in ED Medications - No data to display  ED Course/ Medical  Decision Making/ A&P                           Medical Decision Making Amount and/or Complexity of Data Reviewed Radiology: ordered.  Risk Prescription drug management.  Patient's EKG interpretation shows normal sinus rhythm. Patient here with continued cough and shortness of breath.  Concern for possible pulmonary embolus versus worsening pneumonia.  Chest x-ray per my interpretation not show any new findings.  Subsequent CT angio chest for evaluation of pulmonary embolus shows evidence of worsening right lower lobe pneumonia.  Patient is not hypoxic at this time.  Does not appear to be septic.  He is afebrile.  Shared decision making done with patient and daughter at bedside about admission for observation versus going home on a stronger antibiotic.  They are agreeable to going home and being placed on Levaquin.  Strict return precautions given.        Final Clinical Impression(s) / ED Diagnoses Final diagnoses:  None    Rx / DC Orders ED Discharge Orders     None         Lacretia Leigh, MD 07/07/22 1544

## 2022-07-07 NOTE — Discharge Instructions (Signed)
Stop taking the amoxicillin.  Return here for any trouble breathing, fever, vomiting

## 2022-08-05 LAB — CBC WITH DIFFERENTIAL
Basophils Absolute: 0.1 10*3/uL (ref 0.0–0.2)
Basos: 1 %
EOS (ABSOLUTE): 0.2 10*3/uL (ref 0.0–0.4)
Eos: 5 %
Hematocrit: 40.7 % (ref 37.5–51.0)
Hemoglobin: 13.6 g/dL (ref 13.0–17.7)
Immature Grans (Abs): 0 10*3/uL (ref 0.0–0.1)
Immature Granulocytes: 0 %
Lymphocytes Absolute: 1.9 10*3/uL (ref 0.7–3.1)
Lymphs: 41 %
MCH: 30.4 pg (ref 26.6–33.0)
MCHC: 33.4 g/dL (ref 31.5–35.7)
MCV: 91 fL (ref 79–97)
Monocytes Absolute: 0.4 10*3/uL (ref 0.1–0.9)
Monocytes: 8 %
Neutrophils Absolute: 2.1 10*3/uL (ref 1.4–7.0)
Neutrophils: 45 %
RBC: 4.48 x10E6/uL (ref 4.14–5.80)
RDW: 13 % (ref 11.6–15.4)
WBC: 4.6 10*3/uL (ref 3.4–10.8)

## 2022-08-05 LAB — STREP PNEUMONIAE 23 SEROTYPES IGG
Pneumo Ab Type 1*: 0.1 ug/mL — ABNORMAL LOW (ref >1.3)
Pneumo Ab Type 12 (12F)*: <0.1 ug/mL — ABNORMAL LOW (ref >1.3)
Pneumo Ab Type 14*: 0.9 ug/mL — ABNORMAL LOW (ref >1.3)
Pneumo Ab Type 17 (17F)*: 3.8 ug/mL (ref >1.3)
Pneumo Ab Type 19 (19F)*: 1.2 ug/mL — ABNORMAL LOW (ref >1.3)
Pneumo Ab Type 2*: 0.3 ug/mL — ABNORMAL LOW (ref >1.3)
Pneumo Ab Type 20*: 0.3 ug/mL — ABNORMAL LOW (ref >1.3)
Pneumo Ab Type 22 (22F)*: 0.1 ug/mL — ABNORMAL LOW (ref >1.3)
Pneumo Ab Type 23 (23F)*: 0.5 ug/mL — ABNORMAL LOW (ref >1.3)
Pneumo Ab Type 26 (6B)*: 0.2 ug/mL — ABNORMAL LOW (ref >1.3)
Pneumo Ab Type 3*: 0.2 ug/mL — ABNORMAL LOW (ref >1.3)
Pneumo Ab Type 34 (10A)*: 0.1 ug/mL — ABNORMAL LOW (ref >1.3)
Pneumo Ab Type 4*: <0.1 ug/mL — ABNORMAL LOW (ref >1.3)
Pneumo Ab Type 43 (11A)*: <0.1 ug/mL — ABNORMAL LOW (ref >1.3)
Pneumo Ab Type 5*: 0.2 ug/mL — ABNORMAL LOW (ref >1.3)
Pneumo Ab Type 51 (7F)*: <0.1 ug/mL — ABNORMAL LOW (ref >1.3)
Pneumo Ab Type 54 (15B)*: <0.1 ug/mL — ABNORMAL LOW (ref >1.3)
Pneumo Ab Type 56 (18C)*: <0.1 ug/mL — ABNORMAL LOW (ref >1.3)
Pneumo Ab Type 57 (19A)*: 0.4 ug/mL — ABNORMAL LOW (ref >1.3)
Pneumo Ab Type 68 (9V)*: <0.1 ug/mL — ABNORMAL LOW (ref >1.3)
Pneumo Ab Type 70 (33F)*: 1.3 ug/mL — ABNORMAL LOW (ref >1.3)
Pneumo Ab Type 8*: 0.2 ug/mL — ABNORMAL LOW (ref >1.3)
Pneumo Ab Type 9 (9N)*: 0.2 ug/mL — ABNORMAL LOW (ref >1.3)

## 2022-08-05 LAB — IGG, IGA, IGM
IgA/Immunoglobulin A, Serum: 128 mg/dL (ref 61–437)
IgG (Immunoglobin G), Serum: 826 mg/dL (ref 603–1613)
IgM (Immunoglobulin M), Srm: 177 mg/dL — ABNORMAL HIGH (ref 20–172)

## 2022-08-05 LAB — DIPHTHERIA / TETANUS ANTIBODY PANEL
Diphtheria Ab: <0.1 IU/mL — ABNORMAL LOW (ref <0.10)
Tetanus Ab, IgG: 2.7 IU/mL (ref <0.10)

## 2022-08-05 LAB — COMPLEMENT, TOTAL: Compl, Total (CH50): 53 U/mL (ref >41)

## 2022-12-12 NOTE — Therapy (Signed)
[facility] [address] [place] , [place], [address] Phone: [phone]   Fax:  [phone]  Patient Details  Name: Brett Humphrey MRN: [id number] Date of Birth: [birth year] Referring Provider:  Almedia Balls, NP  Encounter Date: 12/12/2022  SPEECH THERAPY DISCHARGE SUMMARY  Visits from [facility]: one (eval)  Current functional level related to goals / functional outcomes: Pt did not return for scheduled ST appointments after his evaluation in October 2022. It is assumed ST was not desired.   SLP Short Term Goals - 07/28/21 1538                SLP SHORT TERM GOAL #1    Title pt will demonstrate awareness of errors in mod complex written and spoken language tasks in 3 sessions     Time 4     Period Weeks     Status New     Target Date 08/25/21          SLP SHORT TERM GOAL #2    Title pt will complete formal cognitive linguisitic testing     Time 2     Period Weeks     Status New     Target Date 08/13/21          SLP SHORT TERM GOAL #3    Title pt will demo WNL language fluency, and will undergo formal language assessment if goal is not met     Time 4     Period Weeks     Status New     Target Date 08/25/21             SLP Long Term Goals - 07/28/21 1545                SLP LONG TERM GOAL #1    Title pt will demo ability to perform two min complex language tasks simultaneously in 3 sessions     Time 8     Period Weeks     Target Date 09/25/21                     Plan - 07/28/21 1432       Clinical Impression Statement Today, Haskell Riling "Lennette Bihari" presents with mild expressive (verbal) deficits observed in mod complex conversation, characterized by short pauses and hesitations in his speech fluency. 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 SLP will need to administer more in-depth cognitive testing to ascertain more complete understanding of his mild cognitive deficits. He would benefit from skilled ST to target mild cognitive deficits and to monitor verbal expression ability. If expressive language does not resolve in the next ~2 weeks it may be necessary to formally assess pt's language skills. Notably pt appeared reticent to schedule therapies (PT or ST) before he is able to get more definte understanding of his cardiologic status, and pt made known his concern about his atrial fib x4 during the 40 minute evaluation today.     Speech Therapy Frequency 2x / week     Duration 8 weeks        Remaining deficits: Assumed deficits remain as pt  is unexpectedly discharged due to not seen since evaluation.   Education / Equipment: See therapy note dated 07-28-21.  Patient agrees to discharge. Patient goals were not met. Patient is being discharged due to not returning since the last visit.Marland Kitchen    [facility], [place] 12/12/2022, 9:38 AM  [facility] [address] [place], [place], [address] Phone: [phone]   Fax:  [phone]

## 2024-09-06 ENCOUNTER — Other Ambulatory Visit: Payer: Self-pay | Admitting: Family Medicine

## 2024-09-06 DIAGNOSIS — Z87891 Personal history of nicotine dependence: Secondary | ICD-10-CM

## 2024-09-23 ENCOUNTER — Other Ambulatory Visit

## 2024-10-01 ENCOUNTER — Ambulatory Visit
Admission: RE | Admit: 2024-10-01 | Discharge: 2024-10-01 | Disposition: A | Discharge status: Home / Self Care | Source: Ambulatory Visit | Attending: Family Medicine | Admitting: Family Medicine

## 2024-10-01 DIAGNOSIS — Z87891 Personal history of nicotine dependence: Secondary | ICD-10-CM

## 2024-10-29 ENCOUNTER — Encounter: Payer: Self-pay | Admitting: Student in an Organized Health Care Education/Training Program

## 2024-10-29 ENCOUNTER — Ambulatory Visit: Admitting: Student in an Organized Health Care Education/Training Program

## 2024-10-29 ENCOUNTER — Other Ambulatory Visit (HOSPITAL_COMMUNITY): Payer: Self-pay

## 2024-10-29 ENCOUNTER — Ambulatory Visit
Attending: Student in an Organized Health Care Education/Training Program | Admitting: Student in an Organized Health Care Education/Training Program

## 2024-10-29 VITALS — BP 139/80 | HR 88 | Ht 70.0 in | Wt 202.7 lb

## 2024-10-29 DIAGNOSIS — R072 Precordial pain: Secondary | ICD-10-CM

## 2024-10-29 DIAGNOSIS — E782 Mixed hyperlipidemia: Secondary | ICD-10-CM | POA: Diagnosis not present

## 2024-10-29 DIAGNOSIS — I10 Essential (primary) hypertension: Secondary | ICD-10-CM

## 2024-10-29 DIAGNOSIS — I4811 Longstanding persistent atrial fibrillation: Secondary | ICD-10-CM

## 2024-10-29 MED ORDER — RIVAROXABAN 20 MG PO TABS
20.0000 mg | ORAL_TABLET | Freq: Every day | ORAL | 1 refills | Status: AC
  Filled 2024-10-29: qty 90, 90d supply, fill #0

## 2024-10-29 MED ORDER — NITROGLYCERIN 0.4 MG SL SUBL
0.4000 mg | SUBLINGUAL_TABLET | SUBLINGUAL | 3 refills | Status: AC | PRN
  Filled 2024-10-29: qty 25, 8d supply, fill #0

## 2024-10-29 NOTE — Progress Notes (Incomplete)
" °  Cardiology Office Note:  .   Date:  10/29/2024  ID:  Brett Humphrey, DOB [birth year], MRN [id number] PCP: Brett Therisa MATSU, PA  [facility] Providers Cardiologist:  None { Chief Complaint: No chief complaint on file.   History of Present Illness: .    Brett Humphrey is a 64 y.o. male with a PMH of CAD s/p PCI to LAD (09/2022), PAF s/p PVI (2/23, on Eliquis ), HLD, HTN, prior CVA, OSA and family history of premature CAD who presents as a new patient referral to establish cardiovascular care.   Pre-Chart Notes: - Patient was previously followed by cardiology at [facility] last seen in 2024.  Discussed the use of AI scribe software for clinical note transcription with the patient, who gave verbal consent to proceed.  History of Present Illness       Studies Reviewed: SABRA    EKG: ***           Results   Risk Assessment/Calculations:    {Does this patient have ATRIAL FIBRILLATION?:[phone]} No BP recorded.  {Refresh Note OR Click here to enter BP  :1}***        Physical Exam:    VS:  There were no vitals taken for this visit. ***    Wt Readings from Last 3 Encounters:  07/07/22 190 lb (86.2 kg)  05/29/22 192 lb 10.9 oz (87.4 kg)  06/06/18 178 lb 12.7 oz (81.1 kg)     GEN: Well nourished, well developed, in no acute distress NECK: No JVD; No carotid bruits CARDIAC: ***RRR, no murmurs, rubs, gallops RESPIRATORY:  Clear to auscultation without rales, wheezing or rhonchi  ABDOMEN: Soft, non-tender, non-distended, normal bowel sounds EXTREMITIES:  Warm and well perfused, no edema; No deformity, 2+ radial pulses PSYCH: Normal mood and affect   ASSESSMENT AND PLAN: .     #CAD s/p PCI to LAD   #PAF s/p Ablation    #HTN   #HLD       {Are you ordering a CV Procedure (e.g. stress test, cath, DCCV, TEE, etc)?   Press F2        :[id number]}   This note was written with the assistance of a dictation microphone or AI dictation software. Please excuse  any typos or grammatical errors.   Signed, Georganna Archer, MD  10/29/2024 10:23 AM    [facility] "

## 2024-10-29 NOTE — Patient Instructions (Addendum)
 Medication Instructions:  STOP Aspirin 81 mg  STOP Eliquis    START Xarelto  20 mg daily  START AS NEEDED FOR CHEST PAIN: Nitroglycerin    *If you need a refill on your cardiac medications before your next appointment, please call your pharmacy*  Lab Work: CMP LIPID PANEL   If you have labs (blood work) drawn today and your tests are completely normal, you will receive your results only by: MyChart Message (if you have MyChart) OR A paper copy in the mail If you have any lab test that is abnormal or we need to change your treatment, we will call you to review the results.  Testing/Procedures: PET/CT STRESS TEST   How to Prepare for Your Cardiac PET/CT Stress Test:  1. Please do not take these medications before your test:  ~Medications that may interfere with the cardiac pharmacological stress agent (ex. nitrates - including erectile dysfunction medications, isosorbide mononitrate, tamulosin or beta-blockers) the day of the exam. (Erectile dysfunction medication should be held for at least 72 hrs prior to test) ~Theophylline containing medications for 12 hours. ~Dipyridamole 48 hours prior to the test. ~Your remaining medications may be taken with water.  2. Nothing to eat or drink, except water, 3 hours prior to arrival time.   ~ NO caffeine/decaffeinated products, or chocolate 12 hours prior to arrival.  3. NO perfume, cologne or lotion on chest or abdomen area.         - FEMALES - Please avoid wearing dresses to this appointment.  4. Total time is 1 to 2 hours; you may want to bring reading material for the waiting time.  Please report to Radiology at the [facility] Main Entrance 30 minutes early for your test. [address] [place], [place] [address]  OR  Please report to Radiology at [facility] Main Entrance, medical mall, 30 mins prior to your test. [address] [place], [place] [phone]  Diabetic Preparation: - Hold  oral medications. - You may take NPH and Lantus insulin. - Do not take Humalog or Humulin R (Regular Insulin) the day of your test. - Check blood sugars prior to leaving the house. - If able to eat breakfast prior to 3 hour fasting, you may take all medications, including your insulin, - Do not worry if you miss your breakfast dose of insulin - start at your next meal. - Patients who wear a continuous glucose monitor MUST remove the device prior to scanning.  IF YOU THINK YOU MAY BE PREGNANT, OR ARE NURSING PLEASE INFORM THE TECHNOLOGIST.  In preparation for your appointment, medication and supplies will be purchased.  Appointment availability is limited, so if you need to cancel or reschedule, please call the Radiology Department at [phone] [facility]) OR [phone] [facility])  24 hours in advance to avoid a cancellation fee of $100.00  What to Expect After you Arrive:  Once you arrive and check in for your appointment, you will be taken to a preparation room within the Radiology Department.  A technologist or Nurse will obtain your medical history, verify that you are correctly prepped for the exam, and explain the procedure.  Afterwards,  an IV will be started in your arm and electrodes will be placed on your skin for EKG monitoring during the stress portion of the exam. Then you will be escorted to the PET/CT scanner.  There, staff will get you positioned on the scanner and obtain a blood pressure and EKG.  During the exam, you  will continue to be connected to the EKG and blood pressure machines.  A small, safe amount of a radioactive tracer will be injected in your IV to obtain a series of pictures of your heart along with an injection of a stress agent.    After your Exam:  It is recommended that you eat a meal and drink a caffeinated beverage to counter act any effects of the stress agent.  Drink plenty of fluids for the remainder of the day and urinate frequently for the first couple  of hours after the exam.  Your doctor will inform you of your test results within 7-10 business days.  For more information and frequently asked questions, please visit our website : [email or web address]  For questions about your test or how to prepare for your test, please call: Cardiac Imaging Nurse Navigators Office: [phone]    Follow-Up: At [facility], you and your health needs are our priority.  As part of our continuing mission to provide you with exceptional heart care, our providers are all part of one team.  This team includes your primary Cardiologist (physician) and Advanced Practice Providers or APPs (Physician Assistants and Nurse Practitioners) who all work together to provide you with the care you need, when you need it.  Your next appointment:   3 month(s)  Provider:   Dr. Floretta   We recommend signing up for the patient portal called MyChart.  Sign up information is provided on this After Visit Summary.  MyChart is used to connect with patients for Virtual Visits (Telemedicine).  Patients are able to view lab/test results, encounter notes, upcoming appointments, etc.  Non-urgent messages can be sent to your provider as well.   To learn more about what you can do with MyChart, go to [email or web address].   Other Instructions CHECK BLOOD PRESSURE TWICE DAILY FOR 2 WEEKS AND ADVISE RESULTS ONCE COMPLETED:  Blood Pressure Record Sheet To take your blood pressure, you will need a blood pressure machine. You can buy a blood pressure machine (blood pressure monitor) at your clinic, drug store, or online. When choosing one, consider: An automatic monitor that has an arm cuff. A cuff that wraps snugly around your upper arm. You should be able to fit only one finger between your arm and the cuff. A device that stores blood pressure reading results. Do not choose a monitor that measures your blood pressure from your wrist or finger. Follow your  health care provider's instructions for how to take your blood pressure. To use this form: Take your blood pressure medications every day These measurements should be taken when you have been at rest for at least 10-15 min Take at least 2 readings with each blood pressure check. This makes sure the results are correct. Wait 1-2 minutes between measurements. Write down the results in the spaces on this form. Keep in mind it should always be recorded systolic over diastolic. Both numbers are important.  Repeat this every day for 2-3 weeks, or as told by your health care provider.  Make a follow-up appointment with your health care provider to discuss the results.  Blood Pressure Log Date Medications taken? (Y/N) Blood Pressure Time of Day

## 2024-10-29 NOTE — Progress Notes (Signed)
 " Cardiology Office Note:  .   Date:  10/29/2024  ID:  Brett Humphrey, DOB [birth year], MRN [id number] PCP: Alben Therisa MATSU, PA   [facility] Providers Cardiologist:  Georganna Archer, MD { Chief Complaint:  Chief Complaint  Patient presents with   Chest Pain    History of Present Illness: .    Brett Humphrey is a 64 y.o. male with a PMH of CAD s/p PCI to LAD (09/2022), PAF s/p PVI (2/23, on Eliquis ), HLD, HTN, prior CVA, OSA and family history of premature CAD who presents as a new patient referral to establish cardiovascular care.  Discussed the use of AI scribe software for clinical note transcription with the patient, who gave verbal consent to proceed.  History of Present Illness Brett Humphrey Arvine Clayburn is a 64 year old male with atrial fibrillation and a history of stroke who presents with concerns about Eliquis  and migraines.  He has been experiencing frequent migraines since starting Eliquis  following a stroke. Despite consulting with various doctors who indicated that Eliquis  typically does not cause headaches or migraines, he found through personal research that headaches are a listed side effect. He has been working with a neurologist and initially tried gabapentin  three times a day, which helped but was not fully effective.  He independently reduced his Eliquis  dose to 2.5 mg twice a day, which has significantly decreased his migraines. He is concerned about whether the reduced dose of Eliquis  is sufficient and is hesitant to return to the full dose of 5 mg twice a day due to the migraines.  He experiences chest tightness during stressful events and at other random times. The tightness is not similar to the sharp, breath-taking pain he experienced with a previous blocked artery, which led to a stent placement after a cardiac CT revealed a 90% blockage. No swelling in the legs or trouble breathing while lying flat, but he reports some shortness of breath with exertion. A recent  chest CT scan indicated emphysema and scarring from a past pneumonia that required hospitalization.  He is currently taking atorvastatin  80 mg once a day and losartan  100 mg daily. He also takes a baby aspirin daily. No amiodarone use. He monitors his blood pressure at home, noting it is usually around 128-130/80-85, though it has been slightly higher recently.  He quit smoking 18 years ago and denies any illicit drug use. 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 He moved from [place] to [place] in 2003.    Studies Reviewed: SABRA    EKG:  EKG Interpretation Date/Time:  Tuesday October 29 2024 15:02:10 EST Ventricular Rate:  82 PR Interval:  170 QRS Duration:  102 QT Interval:  352 QTC Calculation: 411 R Axis:   -35  Text Interpretation: Normal sinus rhythm Left axis deviation Non-specific intra-ventricular conduction delay When compared with ECG of 07-Jul-2022 12:01, Confirmed by Archer Georganna [id number]) on 10/29/2024 3:16:08 PM         Results   Risk Assessment/Calculations:     CHA2DS2-VASc Score = 3   This indicates a 3.2% annual risk of stroke. The patient's score is based upon: CHF History: 0 HTN History: 1 Diabetes History: 0 Stroke History: 2 Vascular Disease History: 0 Age Score: 0 Gender Score: 0             Physical Exam:    VS:  BP 139/80 (BP Location: Left Arm, Patient Position: Sitting, Cuff Size: Normal)  Pulse 88   Ht 5' 10 (1.778 m)   Wt 202 lb 11.2 oz (91.9 kg)   SpO2 94%   BMI 29.08 kg/m      Wt Readings from Last 3 Encounters:  10/29/24 202 lb 11.2 oz (91.9 kg)  07/07/22 190 lb (86.2 kg)  05/29/22 192 lb 10.9 oz (87.4 kg)     GEN: Well nourished, well developed, in no acute distress NECK: No JVD; No carotid bruits CARDIAC: RRR, no murmurs, rubs, gallops RESPIRATORY:  Clear to auscultation without rales, wheezing or rhonchi  ABDOMEN: Soft, non-tender, non-distended, normal bowel  sounds EXTREMITIES:  Warm and well perfused, no edema; No deformity, 2+ radial pulses PSYCH: Normal mood and affect   ASSESSMENT AND PLAN: .     #CAD s/p PCI to LAD #Chest Pains - Patient with known history of CAD now reports having intermittent chest tightness.  He does not formally exert himself often so he is unsure if there is an exertional component. - LHC performed in 2023 showed single-vessel LAD disease, so stress testing should be fairly reliable. - Will perform a PET stress test to rule out an ischemic etiology to his recurrent chest pain. PET stress test Stop baby aspirin Start as needed sublingual nitroglycerin  Follow-up in 3 months   #PAF s/p Ablation  - The patient underwent a prior ablation and has had no no recurrences of atrial fibrillation. - His CHA2DS2-VASc score = 3 which warrants long-term oral anticoagulation (although there is some newer data to suggest that patients may be able to stop OAC after ablation). - He reports that Eliquis  is causing him migraine headaches and I think that it is reasonable for him to switch to a different DOAC to see if he finds relief. - Will start Xarelto . Stop Eliquis  Start Xarelto  20 mg daily Stopping baby aspirin since he is on full dose DOAC   #HTN - BPs are little bit elevated today but he checks his blood pressures at home and says that the systolic number looks good but the diastolic ranges between 80-85. - The patient will do a 2-week blood pressure log and let me know the results. Continue losartan  100 mg daily  #HLD Lipid panel       Informed Consent   Shared Decision Making/Informed Consent The risks [chest pain, shortness of breath, cardiac arrhythmias, dizziness, blood pressure fluctuations, myocardial infarction, stroke/transient ischemic attack, nausea, vomiting, allergic reaction, radiation exposure, metallic taste sensation and life-threatening complications (estimated to be 1 in 10,000)], benefits (risk  stratification, diagnosing coronary artery disease, treatment guidance) and alternatives of a cardiac PET stress test were discussed in detail with Brett Humphrey and he agrees to proceed.      This note was written with the assistance of a dictation microphone or AI dictation software. Please excuse any typos or grammatical errors.   Signed, Georganna Archer, MD  10/29/2024 3:16 PM    [facility] "

## 2024-10-30 ENCOUNTER — Ambulatory Visit: Payer: Self-pay | Admitting: Student in an Organized Health Care Education/Training Program

## 2024-10-30 LAB — COMPREHENSIVE METABOLIC PANEL WITH GFR
ALT: 39 [IU]/L (ref 0–44)
AST: 34 [IU]/L (ref 0–40)
Albumin: 4.6 g/dL (ref 3.9–4.9)
Alkaline Phosphatase: 59 [IU]/L (ref 47–123)
BUN/Creatinine Ratio: 14 (ref 10–24)
BUN: 19 mg/dL (ref 8–27)
Bilirubin Total: 1.2 mg/dL (ref 0.0–1.2)
CO2: 22 mmol/L (ref 20–29)
Calcium: 9.7 mg/dL (ref 8.6–10.2)
Chloride: 101 mmol/L (ref 96–106)
Creatinine, Ser: 1.32 mg/dL — AB (ref 0.76–1.27)
Globulin, Total: 2.2 g/dL (ref 1.5–4.5)
Glucose: 85 mg/dL (ref 70–99)
Potassium: 4.6 mmol/L (ref 3.5–5.2)
Sodium: 141 mmol/L (ref 134–144)
Total Protein: 6.8 g/dL (ref 6.0–8.5)
eGFR: 61 mL/min/{1.73_m2}

## 2024-10-30 LAB — LIPID PANEL
Chol/HDL Ratio: 3.4 ratio (ref 0.0–5.0)
Cholesterol, Total: 181 mg/dL (ref 100–199)
HDL: 53 mg/dL
LDL Chol Calc (NIH): 65 mg/dL (ref 0–99)
Triglycerides: 406 mg/dL — ABNORMAL HIGH (ref 0–149)
VLDL Cholesterol Cal: 63 mg/dL — ABNORMAL HIGH (ref 5–40)

## 2024-10-30 NOTE — Telephone Encounter (Signed)
 Patient is returning call

## 2024-11-04 NOTE — Telephone Encounter (Signed)
 Pt requesting a c/b to discuss results.

## 2024-11-05 ENCOUNTER — Encounter: Payer: Self-pay | Admitting: Student in an Organized Health Care Education/Training Program

## 2024-11-05 DIAGNOSIS — E782 Mixed hyperlipidemia: Secondary | ICD-10-CM

## 2024-11-06 MED ORDER — ICOSAPENT ETHYL 1 G PO CAPS: 2.0000 g | ORAL_CAPSULE | Freq: Two times a day (BID) | ORAL | 3 refills | Status: AC

## 2024-11-08 ENCOUNTER — Telehealth: Payer: Self-pay | Admitting: Pharmacy Technician

## 2024-11-08 ENCOUNTER — Encounter: Payer: Self-pay | Admitting: Student in an Organized Health Care Education/Training Program

## 2024-11-08 NOTE — Telephone Encounter (Signed)
 Pharmacy Patient Advocate Encounter  Received notification from [employer] that Prior Authorization for icosapent  has been APPROVED from 11/08/24 to 10/02/2038   PA #/Case ID/Reference #: [id number]

## 2024-11-08 NOTE — Telephone Encounter (Signed)
" ° °  Pharmacy Patient Advocate Encounter   Received notification from [facility] KEY that prior authorization for icosapent  is required/requested.   Insurance verification completed.   The patient is insured through [facility].   Per test claim: PA required; PA submitted to above mentioned insurance via Latent Key/confirmation #/EOC [id number] Status is pending  "

## 2024-11-19 ENCOUNTER — Other Ambulatory Visit (HOSPITAL_COMMUNITY)

## 2025-01-22 ENCOUNTER — Ambulatory Visit: Admitting: Student in an Organized Health Care Education/Training Program
